# Patient Record
Sex: Male | Born: 1980 | Race: Black or African American | Hispanic: No | Marital: Married | Smoking: Light tobacco smoker
Health system: Southern US, Community
[De-identification: ages and names within clinical notes are randomized; demographics above are authoritative.]

## PROBLEM LIST (undated history)

## (undated) DIAGNOSIS — M109 Gout, unspecified: Secondary | ICD-10-CM

## (undated) DIAGNOSIS — K6289 Other specified diseases of anus and rectum: Secondary | ICD-10-CM

## (undated) DIAGNOSIS — I00 Rheumatic fever without heart involvement: Secondary | ICD-10-CM

## (undated) DIAGNOSIS — L0291 Cutaneous abscess, unspecified: Secondary | ICD-10-CM

## (undated) HISTORY — PX: ADENOIDECTOMY: SUR15

## (undated) HISTORY — DX: Rheumatic fever without heart involvement: I00

## (undated) HISTORY — PX: PILONIDAL CYST EXCISION: SHX744

## (undated) HISTORY — DX: Other specified diseases of anus and rectum: K62.89

---
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
## 2000-07-19 ENCOUNTER — Emergency Department (HOSPITAL_COMMUNITY): Admission: EM | Admit: 2000-07-19 | Discharge: 2000-07-19 | Payer: Self-pay | Admitting: Emergency Medicine

## 2004-12-13 ENCOUNTER — Emergency Department (HOSPITAL_COMMUNITY): Admission: EM | Admit: 2004-12-13 | Discharge: 2004-12-13 | Payer: Self-pay | Admitting: Emergency Medicine

## 2004-12-16 ENCOUNTER — Emergency Department (HOSPITAL_COMMUNITY): Admission: EM | Admit: 2004-12-16 | Discharge: 2004-12-16 | Payer: Self-pay | Admitting: Emergency Medicine

## 2004-12-19 ENCOUNTER — Emergency Department (HOSPITAL_COMMUNITY): Admission: EM | Admit: 2004-12-19 | Discharge: 2004-12-19 | Payer: Self-pay | Admitting: Emergency Medicine

## 2009-01-18 ENCOUNTER — Emergency Department (HOSPITAL_COMMUNITY): Admission: EM | Admit: 2009-01-18 | Discharge: 2009-01-18 | Payer: Self-pay | Admitting: Emergency Medicine

## 2010-11-10 ENCOUNTER — Emergency Department (HOSPITAL_COMMUNITY)
Admission: EM | Admit: 2010-11-10 | Discharge: 2010-11-10 | Disposition: A | Discharge status: Home / Self Care | Payer: BC Managed Care – PPO | Attending: Emergency Medicine | Admitting: Emergency Medicine

## 2010-11-10 DIAGNOSIS — K6289 Other specified diseases of anus and rectum: Secondary | ICD-10-CM | POA: Insufficient documentation

## 2010-11-10 LAB — OCCULT BLOOD, POC DEVICE: Fecal Occult Bld: NEGATIVE

## 2010-11-15 ENCOUNTER — Encounter (INDEPENDENT_AMBULATORY_CARE_PROVIDER_SITE_OTHER): Payer: Self-pay | Admitting: Surgery

## 2010-11-16 ENCOUNTER — Ambulatory Visit (INDEPENDENT_AMBULATORY_CARE_PROVIDER_SITE_OTHER): Payer: BC Managed Care – PPO | Admitting: Surgery

## 2010-11-16 ENCOUNTER — Encounter (INDEPENDENT_AMBULATORY_CARE_PROVIDER_SITE_OTHER): Payer: Self-pay | Admitting: Surgery

## 2010-11-16 VITALS — BP 130/94 | HR 68 | Temp 98.1°F | Ht 72.0 in | Wt 219.2 lb

## 2010-11-16 DIAGNOSIS — K602 Anal fissure, unspecified: Secondary | ICD-10-CM

## 2010-11-16 NOTE — Progress Notes (Addendum)
Chief Complaint  Patient presents with  . Other    new pt- rectal pain 2 weeks    Andrew Foley is a 30 y.o. ([birth year]) AA male who is a patient of Foley,NOELLE, PA, PA and comes to me today for evaluation of rectal pain/hemorrhoids.  The patient first noticed severe rectal pain after a bowel movement on or around Monday, 07 November 2010. He went to the [facility] emergency room on Wednesday, 09 November 2010 for the rectal pain. He then was seen by Altamease Oiler Foley with [facility] on Monday, 14 November 2010. She referred him to office for further evaluation.  He denies any history of prior rectal disease, peptic ulcer, disease liver disease, pancreatic disease, or colin disease. He has no history of Crohn's disease or colitis. He had no history of abdominal surgery.  Past Medical History  Diagnosis Date  . Rheumatic fever     as a child  . Rectal pain   . Hemorrhoids     Past Surgical History  Procedure Date  . Pilonidal cyst excision   . Adenoidectomy     Current Outpatient Prescriptions  Medication Sig Dispense Refill  . HYDROcodone-acetaminophen (VICODIN) 5-500 MG per tablet Every 6 hours as needed.      . hydrocortisone-pramoxine (PROCTOFOAM HC) rectal foam Place 1 applicator rectally 2 (two) times daily as needed.          Allergies  Allergen Reactions  . Codeine    Review of Systems: Skin:  No history of rash.  No history of abnormal moles. Infection:  No history of MRSA. Neurologic:  No history of stroke.  No history of seizure.  No history of headaches. Cardiac:  No history of hypertension. No history of heart disease.   Pulmonary:  Does not smoke cigarettes.  No asthma or bronchitis.    Endocrine:  No diabetes. No thyroid disease. Gastrointestinal: See HPI. Urologic:  No history of kidney stones.  No history of bladder infections. Musculoskeletal:  No history of joint or back disease. Hematologic:  No bleeding disorder.  No history of anemia.  Not  anticoagulated.  SOCIAL and FAMILY HISTORY: Accompanied by father, Andrew Foley. Works Photographer at [employer].  Lives with parents.  PHYSICAL EXAM: BP 130/94  Pulse 68  Temp(Src) 98.1 F (36.7 C) (Temporal)  Ht 6' (1.829 m)  Wt 219 lb 3.2 oz (99.428 kg)  BMI 29.73 kg/m2  HEENT: Normal. Pupils equal. Normal dentition. Neck: Supple. No thyroid mass. Lymph Nodes:  No supraclavicular or cervical nodes.  Abdomen: No mass. No tenderness. No hernia. Normal bowel sounds.  No abdominal scars. Rectal: Severe rectal spasm c/w anal fissure.  No external hemorrhoids.  No evidence of infection.  Extremities:  Good strength in upper and lower extremities. Neurologic:  Grossly intact to motor and sensory function.  DATA REVIEWED: Notes from Noelle Foley  ASSESSMENT and PLAN: 1.  Anal fissure. Literature given on rectal disease. He has Vicodin already.  Will also give him Diltiazem 2%.  Encourage sitz baths 3 x / day.  Stool softeners. See back in 4 weeks for recheck.  [mother - Andrew Foley [phone] - called because of her concern for son.  I tried to answer questions.  I will give him a prescription for Percocet (5/325) #30 and a note to be out of work till 11/30/10. dn 11/17/2010]    [Pt called for a refill of Percocet - Jade in office.  DN [phone]]  2.  Otherwise healthy.

## 2010-11-16 NOTE — Patient Instructions (Signed)
Diltiazem to anus 4x/day.  Sitz baths 3 times per day.  Stool softener (Colace) as needed.

## 2010-11-17 ENCOUNTER — Telehealth (INDEPENDENT_AMBULATORY_CARE_PROVIDER_SITE_OTHER): Payer: Self-pay

## 2010-11-17 ENCOUNTER — Other Ambulatory Visit (INDEPENDENT_AMBULATORY_CARE_PROVIDER_SITE_OTHER): Payer: Self-pay | Admitting: General Surgery

## 2010-11-17 DIAGNOSIS — K602 Anal fissure, unspecified: Secondary | ICD-10-CM

## 2010-11-17 NOTE — Telephone Encounter (Signed)
Patients mom called in and said her son is still having increased pain and hydrocodone is not working. She said he is taking the diltiazem and soaking 4x day and the pain will not be eased. She said he has missed 3 weeks of work, cannot not sleep, and that he cannot wait until his Sept 14th appointment. Is there any more suggestionsto help her son out?

## 2010-11-17 NOTE — Telephone Encounter (Signed)
Dr Ezzard Standing called and spoke to the mom.  He wrote for percocet and Proctofoam.  A return to work note was written

## 2010-11-28 ENCOUNTER — Ambulatory Visit (INDEPENDENT_AMBULATORY_CARE_PROVIDER_SITE_OTHER): Payer: BC Managed Care – PPO | Admitting: General Surgery

## 2010-12-06 ENCOUNTER — Telehealth (INDEPENDENT_AMBULATORY_CARE_PROVIDER_SITE_OTHER): Payer: Self-pay | Admitting: General Surgery

## 2010-12-06 MED ORDER — OXYCODONE-ACETAMINOPHEN 5-325 MG PO TABS: 1.0000 | ORAL_TABLET | Freq: Four times a day (QID) | ORAL | Status: AC | PRN

## 2010-12-06 NOTE — Telephone Encounter (Signed)
Patient has fissure. He is doing warm tub soaks and taking a stool softener with his pain medicine. He is taking oxycodone to help with the pain. Patient has gone back to work on light duty until his appt on 12/16/10. He is not taking pain medicine at work. He is asking for a refill of his oxycodone until he is seen back in the office. Dr Ezzard Standing paged for advise. Per Dr Ezzard Standing ok to refill his percocet 5/325 #30 with no refills. Please have partner write.  Patient's mother made aware that the prescription is at the front for pick up.

## 2010-12-16 ENCOUNTER — Encounter (INDEPENDENT_AMBULATORY_CARE_PROVIDER_SITE_OTHER): Payer: Self-pay | Admitting: Surgery

## 2010-12-16 ENCOUNTER — Ambulatory Visit (INDEPENDENT_AMBULATORY_CARE_PROVIDER_SITE_OTHER): Payer: BC Managed Care – PPO | Admitting: Surgery

## 2010-12-16 VITALS — BP 124/84 | HR 76 | Wt 218.6 lb

## 2010-12-16 DIAGNOSIS — K602 Anal fissure, unspecified: Secondary | ICD-10-CM

## 2010-12-16 NOTE — Progress Notes (Signed)
ASSESSMENT AND PLAN: 1. Anal fissure.   Clearly better.  Prescription for diltiazem.  I gave his a note for work today.  Return to office PRN.    2. Otherwise healthy.  HISTORY OF PRESENT ILLNESS: Chief complaint - Rectal fissure.  Andrew Foley is a 30 y.o. (DOB: November 16, 1980)  AA male who is a patient of REDMON,NOELLE, PA, PA and comes to me today for rectal fissure.  The patient is clearly better.  He is doing the sitz baths 2 to 3 x per day.  He is more regular with his bowel movements. He is not needing the pain meds any more.  We talked about surgery again and how it is a last resort.  He needs note for work.  And he needs another prescription for Diltiazem.  PHYSICAL EXAM: BP 124/84  Pulse 76  Wt 218 lb 9.6 oz (99.156 kg)  Rectum:  No inflammation.  Minimal discomfort on digital exam.  Much better than last exam.  Minimal rectal spasm.  I suggest he keeps using the diltiazem until the pain is completely resolved.  DATA REVIEWED: None

## 2011-03-10 ENCOUNTER — Encounter (HOSPITAL_COMMUNITY): Admission: RE | Payer: Self-pay | Source: Ambulatory Visit

## 2011-03-10 ENCOUNTER — Ambulatory Visit (HOSPITAL_COMMUNITY): Admission: RE | Admit: 2011-03-10 | Payer: BC Managed Care – PPO | Source: Ambulatory Visit | Admitting: Surgery

## 2011-03-10 SURGERY — SPHINCTEROTOMY, ANAL
Anesthesia: General | Site: Abdomen

## 2015-02-05 ENCOUNTER — Emergency Department (HOSPITAL_COMMUNITY)
Admission: EM | Admit: 2015-02-05 | Discharge: 2015-02-05 | Disposition: A | Discharge status: Home / Self Care | Payer: Self-pay | Attending: Emergency Medicine | Admitting: Emergency Medicine

## 2015-02-05 ENCOUNTER — Emergency Department (HOSPITAL_COMMUNITY): Payer: Self-pay

## 2015-02-05 ENCOUNTER — Encounter (HOSPITAL_COMMUNITY): Payer: Self-pay | Admitting: Emergency Medicine

## 2015-02-05 DIAGNOSIS — S93601A Unspecified sprain of right foot, initial encounter: Secondary | ICD-10-CM | POA: Insufficient documentation

## 2015-02-05 DIAGNOSIS — X500XXA Overexertion from strenuous movement or load, initial encounter: Secondary | ICD-10-CM | POA: Insufficient documentation

## 2015-02-05 DIAGNOSIS — Y998 Other external cause status: Secondary | ICD-10-CM | POA: Insufficient documentation

## 2015-02-05 DIAGNOSIS — Z720 Tobacco use: Secondary | ICD-10-CM | POA: Insufficient documentation

## 2015-02-05 DIAGNOSIS — Y9389 Activity, other specified: Secondary | ICD-10-CM | POA: Insufficient documentation

## 2015-02-05 DIAGNOSIS — Y9289 Other specified places as the place of occurrence of the external cause: Secondary | ICD-10-CM | POA: Insufficient documentation

## 2015-02-05 MED ORDER — IBUPROFEN 600 MG PO TABS: 600.0000 mg | ORAL_TABLET | Freq: Four times a day (QID) | ORAL | Status: DC | PRN

## 2015-02-05 NOTE — ED Notes (Signed)
Playing in yard last pm, twisted right great toe/foot. Slight swelling.

## 2015-02-05 NOTE — Discharge Instructions (Signed)
Read the information below.  Use the prescribed medication as directed.  Please discuss all new medications with your pharmacist.  You may return to the Emergency Department at any time for worsening condition or any new symptoms that concern you.    If you develop uncontrolled pain, weakness or numbness of the extremity, severe discoloration of the skin, or you are unable to walk or move your toe, return to the ER for a recheck.      Foot Sprain A foot sprain is an injury to one of the strong bands of tissue (ligaments) that connect and support the many bones in your feet. The ligament can be stretched too much or it can tear. A tear can be either partial or complete. The severity of the sprain depends on how much of the ligament was damaged or torn. CAUSES A foot sprain is usually caused by suddenly twisting or pivoting your foot. RISK FACTORS This injury is more likely to occur in people who:  Play a sport, such as basketball or football.  Exercise or play a sport without warming up.  Start a new workout or sport.  Suddenly increase how long or hard they exercise or play a sport. SYMPTOMS Symptoms of this condition start soon after an injury and include:  Pain, especially in the arch of the foot.  Bruising.  Swelling.  Inability to walk or use the foot to support body weight. DIAGNOSIS This condition is diagnosed with a medical history and physical exam. You may also have imaging tests, such as:  X-rays to make sure there are no broken bones (fractures).  MRI to see if the ligament has torn. TREATMENT Treatment varies depending on the severity of your sprain. Mild sprains can be treated with rest, ice, compression, and elevation (RICE). If your ligament is overstretched or partially torn, treatment usually involves keeping your foot in a fixed position (immobilization) for a period of time. To help you do this, your health care provider will apply a bandage, splint, or walking  boot to keep your foot from moving until it heals. You may also be advised to use crutches or a scooter for a few weeks to avoid bearing weight on your foot while it is healing. If your ligament is fully torn, you may need surgery to reconnect the ligament to the bone. After surgery, a cast or splint will be applied and will need to stay on your foot while it heals. Your health care provider may also suggest exercises or physical therapy to strengthen your foot. HOME CARE INSTRUCTIONS If You Have a Bandage, Splint, or Walking Boot:  Wear it as directed by your health care provider. Remove it only as directed by your health care provider.  Loosen the bandage, splint, or walking boot if your toes become numb and tingle, or if they turn cold and blue. Bathing  If your health care provider approves bathing and showering, cover the bandage or splint with a watertight plastic bag to protect it from water. Do not let the bandage or splint get wet. Managing Pain, Stiffness, and Swelling   If directed, apply ice to the injured area:  Put ice in a plastic bag.  Place a towel between your skin and the bag.  Leave the ice on for 20 minutes, 2-3 times per day.  Move your toes often to avoid stiffness and to lessen swelling.  Raise (elevate) the injured area above the level of your heart while you are sitting or lying down.  Driving  Do not drive or operate heavy machinery while taking pain medicine.  Do not drive while wearing a bandage, splint, or walking boot on a foot that you use for driving. Activity  Rest as directed by your health care provider.  Do not use the injured foot to support your body weight until your health care provider says that you can. Use crutches or other supportive devices as directed by your health care provider.  Ask your health care provider what activities are safe for you. Gradually increase how much and how far you walk until your health care provider says it is  safe to return to full activity.  Do any exercise or physical therapy as directed by your health care provider. General Instructions  If a splint was applied, do not put pressure on any part of it until it is fully hardened. This may take several hours.  Take medicines only as directed by your health care provider. These include over-the-counter medicines and prescription medicines.  Keep all follow-up visits as directed by your health care provider. This is important.  When you can walk without pain, wear supportive shoes that have stiff soles. Do not wear flip-flops, and do not walk barefoot. SEEK MEDICAL CARE IF:  Your pain is not controlled with medicine.  Your bruising or swelling gets worse or does not get better with treatment.  Your splint or walking boot is damaged. SEEK IMMEDIATE MEDICAL CARE IF:  Your foot is numb or blue.  Your foot feels colder than normal.   This information is not intended to replace advice given to you by your health care provider. Make sure you discuss any questions you have with your health care provider.   Document Released: 09/09/2001 Document Revised: 08/04/2014 Document Reviewed: 01/21/2014 Elsevier Interactive Patient Education 2016 Elsevier Inc.  Adult nurse and RICE WHAT DOES AN ELASTIC BANDAGE DO? Elastic bandages come in different shapes and sizes. They generally provide support to your injury and reduce swelling while you are healing, but they can perform different functions. Your health care provider will help you to decide what is best for your protection, recovery, or rehabilitation following an injury. WHAT ARE SOME GENERAL TIPS FOR USING AN ELASTIC BANDAGE?  Use the bandage as directed by the maker of the bandage that you are using.  Do not wrap the bandage too tightly. This may cut off the circulation in the arm or leg in the area below the bandage.  If part of your body beyond the bandage becomes blue, numb, cold,  swollen, or is more painful, your bandage is most likely too tight. If this occurs, remove your bandage and reapply it more loosely.  See your health care provider if the bandage seems to be making your problems worse rather than better.  An elastic bandage should be removed and reapplied every 3-4 hours or as directed by your health care provider. WHAT IS RICE? The routine care of many injuries includes rest, ice, compression, and elevation (RICE therapy).  Rest Rest is required to allow your body to heal. Generally, you can resume your routine activities when you are comfortable and have been given permission by your health care provider. Ice Icing your injury helps to keep the swelling down and it reduces pain. Do not apply ice directly to your skin.  Put ice in a plastic bag.  Place a towel between your skin and the bag.  Leave the ice on for 20 minutes, 2-3 times per day. Do  this for as long as you are directed by your health care provider. Compression Compression helps to keep swelling down, gives support, and helps with discomfort. Compression may be done with an elastic bandage. Elevation Elevation helps to reduce swelling and it decreases pain. If possible, your injured area should be placed at or above the level of your heart or the center of your chest. WHEN SHOULD I SEEK MEDICAL CARE? You should seek medical care if:  You have persistent pain and swelling.  Your symptoms are getting worse rather than improving. These symptoms may indicate that further evaluation or further X-rays are needed. Sometimes, X-rays may not show a small broken bone (fracture) until a number of days later. Make a follow-up appointment with your health care provider. Ask when your X-ray results will be ready. Make sure that you get your X-ray results. WHEN SHOULD I SEEK IMMEDIATE MEDICAL CARE? You should seek immediate medical care if:  You have a sudden onset of severe pain at or below the area of  your injury.  You develop redness or increased swelling around your injury.  You have tingling or numbness at or below the area of your injury that does not improve after you remove the elastic bandage.   This information is not intended to replace advice given to you by your health care provider. Make sure you discuss any questions you have with your health care provider.   Document Released: 09/09/2001 Document Revised: 12/09/2014 Document Reviewed: 11/03/2013 Elsevier Interactive Patient Education [employer]

## 2015-02-05 NOTE — ED Provider Notes (Signed)
CSN: [id number]     Arrival date & time 02/05/15  4098 History  By signing my name below, I, Soijett Blue, attest that this documentation has been prepared under the direction and in the presence of Trixie Dredge, PA-C Electronically Signed: Soijett Blue, ED Scribe. 02/05/2015. 9:38 AM.   Chief Complaint  Patient presents with  . Foot Pain      The history is provided by the patient. No language interpreter was used.    Andrew Foley is a 34 y.o. male who presents to the Emergency Department complaining of gradual onset of right foot pain x last night. He notes that he was playing in the yard with his son when he twisted his right great toe/foot in a small hole and heard a "pop" following. He reports that his right great toe pain/swelling began this morning. Pt is having associated symptoms of mild right great toe swelling. He notes that he has tried ibuprofen with no relief of his symptoms. He denies right calf pain, right ankle pain, gait problem, color change, rash, wound, and any other symptoms.    Past Medical History  Diagnosis Date  . Rheumatic fever     as a child  . Rectal pain   . Hemorrhoids    Past Surgical History  Procedure Laterality Date  . Pilonidal cyst excision    . Adenoidectomy     Family History  Problem Relation Age of Onset  . Hyperlipidemia Father   . Hyperlipidemia Brother    Social History  Substance Use Topics  . Smoking status: Current Every Day Smoker    Types: Cigars  . Smokeless tobacco: None  . Alcohol Use: 12.0 oz/week    20 Cans of beer per week    Review of Systems  Constitutional: Negative for fever.  Cardiovascular: Negative for leg swelling.  Musculoskeletal: Positive for joint swelling and arthralgias. Negative for gait problem.  Skin: Negative for color change, rash and wound.  Allergic/Immunologic: Negative for immunocompromised state.  Neurological: Negative for weakness and numbness.  Hematological: Does not bruise/bleed  easily.     Allergies  Codeine  Home Medications   Prior to Admission medications   Not on File   BP 125/90 mmHg  Pulse 71  Temp(Src) 97.2 F (36.2 C) (Oral)  Resp 16  Ht 6' (1.829 m)  Wt 230 lb (104.327 kg)  BMI 31.19 kg/m2  SpO2 100% Physical Exam  Constitutional: He appears well-developed and well-nourished. No distress.  HENT:  Head: Normocephalic and atraumatic.  Neck: Neck supple.  Pulmonary/Chest: Effort normal.  Musculoskeletal:       Right ankle: No tenderness. Achilles tendon normal.       Right lower leg: He exhibits no tenderness and no edema.       Right foot: There is tenderness.  Right lower extremity: TTP 1st metatarsal into great toe. Distal sensation intact. Cap refill less than 2 seconds. No break in skin. No discoloration. No other bony tenderness of foot or ankle. Achilles tendon intact. No calf tenderness or edema. No other focal bony tenderness of the lower leg. Distal pulses intact.   Neurological: He is alert.  Skin: He is not diaphoretic.  Nursing note and vitals reviewed.   ED Course  Procedures (including critical care time) DIAGNOSTIC STUDIES: Oxygen Saturation is 100% on RA, nl by my interpretation.    COORDINATION OF CARE: 9:33 AM Discussed treatment plan with pt at bedside which includes right foot xray and pt agreed to plan.  Labs Review Labs Reviewed - No data to display  Imaging Review Dg Foot Complete Right  02/05/2015  CLINICAL DATA:  Acute right foot pain after stepping in hole yesterday. EXAM: RIGHT FOOT COMPLETE - 3+ VIEW COMPARISON:  None. FINDINGS: There is no evidence of fracture or dislocation. There is no evidence of arthropathy or other focal bone abnormality. Soft tissues are unremarkable. IMPRESSION: Normal right foot. Electronically Signed   By: Lupita RaiderJames  Green Jr, M.D.   On: 02/05/2015 10:08   I have personally reviewed and evaluated these images as part of my medical decision-making.   EKG Interpretation None       MDM   Final diagnoses:  Right foot sprain, initial encounter    Afebrile, nontoxic patient with injury to his right great toe while playing with his child in the yard.  Neuro vascularly intact.   Xray negative.   D/C home with postop shoe, ibuprofen, PCP follow up PRN.  Discussed result, findings, treatment, and follow up  with patient.  Pt given return precautions.  Pt verbalizes understanding and agrees with plan.       I personally performed the services described in this documentation, which was scribed in my presence. The recorded information has been reviewed and is accurate.    Trixie Dredgemily Joe Gee, PA-C 02/05/15 1038  Mirian MoMatthew Gentry, MD 02/06/15 [phone]

## 2015-02-14 ENCOUNTER — Emergency Department (HOSPITAL_COMMUNITY): Payer: Self-pay

## 2015-02-14 ENCOUNTER — Encounter (HOSPITAL_COMMUNITY): Payer: Self-pay

## 2015-02-14 ENCOUNTER — Emergency Department (HOSPITAL_COMMUNITY)
Admission: EM | Admit: 2015-02-14 | Discharge: 2015-02-14 | Disposition: A | Discharge status: Home / Self Care | Payer: Self-pay | Attending: Emergency Medicine | Admitting: Emergency Medicine

## 2015-02-14 DIAGNOSIS — Z8719 Personal history of other diseases of the digestive system: Secondary | ICD-10-CM | POA: Insufficient documentation

## 2015-02-14 DIAGNOSIS — W1842XD Slipping, tripping and stumbling without falling due to stepping into hole or opening, subsequent encounter: Secondary | ICD-10-CM | POA: Insufficient documentation

## 2015-02-14 DIAGNOSIS — M79674 Pain in right toe(s): Secondary | ICD-10-CM

## 2015-02-14 DIAGNOSIS — F17210 Nicotine dependence, cigarettes, uncomplicated: Secondary | ICD-10-CM | POA: Insufficient documentation

## 2015-02-14 DIAGNOSIS — Z8619 Personal history of other infectious and parasitic diseases: Secondary | ICD-10-CM | POA: Insufficient documentation

## 2015-02-14 DIAGNOSIS — S99921D Unspecified injury of right foot, subsequent encounter: Secondary | ICD-10-CM | POA: Insufficient documentation

## 2015-02-14 DIAGNOSIS — R52 Pain, unspecified: Secondary | ICD-10-CM

## 2015-02-14 NOTE — ED Notes (Signed)
Pt stable, ambulatory, states understanding of discharge instructions 

## 2015-02-14 NOTE — Discharge Instructions (Signed)
Elevate the area as much as possible. Follow up with ortho, Dr. Eulah PontMurphy, continue to take ibuprofen.

## 2015-02-14 NOTE — ED Provider Notes (Signed)
CSN: [id number]     Arrival date & time 02/14/15  1607 History  By signing my name below, I, Andrew Foley, attest that this documentation has been prepared under the direction and in the presence of Andrew Buffalo, NP. Electronically Signed: Octavia Foley, ED Scribe. 02/14/2015. 6:26 PM.     Chief Complaint  Patient presents with  . Foot Pain      Patient is a 34 y.o. male presenting with lower extremity pain. The history is provided by the patient. No language interpreter was used.  Foot Pain This is a new problem. The current episode started more than 1 week ago. The problem has been gradually worsening. Pertinent negatives include no chest pain, no abdominal pain, no headaches and no shortness of breath. The symptoms are aggravated by walking. Nothing relieves the symptoms. The treatment provided no relief.   HPI Comments: Andrew Foley is a 34 y.o. male who presents to the Emergency Department complaining of constant, gradual worsening right foot pain onset last week. Pt has associated edema and ecchymosis to his right big toe. Pt was seen on 11/4 for the same foot injury. Pt was playing with children when his right foot stepped in a hole and injured his right big toe. He had an x-ray done that came back normal. He has been taking ibuprofen to alleviate the pain with some relief. Pt states he is unable to apply pressure to his foot due to increased pain.   Past Medical History  Diagnosis Date  . Rheumatic fever     as a child  . Rectal pain   . Hemorrhoids    Past Surgical History  Procedure Laterality Date  . Pilonidal cyst excision    . Adenoidectomy     Family History  Problem Relation Age of Onset  . Hyperlipidemia Father   . Hyperlipidemia Brother    Social History  Substance Use Topics  . Smoking status: Current Every Day Smoker    Types: Cigars  . Smokeless tobacco: None  . Alcohol Use: 12.0 oz/week    20 Cans of beer per week    Review of Systems   Respiratory: Negative for shortness of breath.   Cardiovascular: Negative for chest pain.  Gastrointestinal: Negative for abdominal pain.  Musculoskeletal: Positive for joint swelling and arthralgias.  Skin: Positive for color change.  Neurological: Negative for headaches.  All other systems reviewed and are negative.     Allergies  Codeine  Home Medications   Prior to Admission medications   Medication Sig Start Date End Date Taking? Authorizing Provider  ibuprofen (ADVIL,MOTRIN) 600 MG tablet Take 1 tablet (600 mg total) by mouth every 6 (six) hours as needed for mild pain or moderate pain. 02/05/15   Trixie Dredge, PA-C   Triage vitals: BP 129/85 mmHg  Pulse 73  Temp(Src) 98.3 F (36.8 C) (Oral)  Resp 18  SpO2 100% Physical Exam  Constitutional: He is oriented to person, place, and time. He appears well-developed and well-nourished. No distress.  HENT:  Head: Normocephalic and atraumatic.  Eyes: EOM are normal. Pupils are equal, round, and reactive to light.  Neck: Normal range of motion. Neck supple.  Cardiovascular: Normal rate.   Pulmonary/Chest: Effort normal.  Musculoskeletal:       Right foot: There is tenderness and swelling. There is normal range of motion, normal capillary refill, no crepitus, no deformity and no laceration.       Feet:  Tender to palpation of the great toe on  the right and to the ball of the foot, 2+ pulse, ecchymosis noted at base of great toe, plantar and dorsiflexion normal.  Neurological: He is alert and oriented to person, place, and time. No cranial nerve deficit.  Skin: Skin is warm and dry.  Psychiatric: He has a normal mood and affect. His behavior is normal.  Nursing note and vitals reviewed.   ED Course  Procedures  DIAGNOSTIC STUDIES: Oxygen Saturation is 100% on RA, normal by my interpretation.  COORDINATION OF CARE:  5:22 PM Discussed treatment plan with pt at bedside and pt agreed to plan. X-ray, buddy tape, post op shoe,  ice, elevation, ibuprofen Limited ambulation for the next week and follow up with ortho  Labs Review Labs Reviewed - No data to display  Imaging Review Dg Toe Great Right  02/14/2015  CLINICAL DATA:  Stepped in hole injuring right big toe 02/04/2015. Persistent pain. EXAM: RIGHT GREAT TOE COMPARISON:  None. FINDINGS: There is no evidence of fracture or dislocation. There is no evidence of arthropathy or other focal bone abnormality. Soft tissues are unremarkable. IMPRESSION: Negative. Electronically Signed   By: Elberta Fortisaniel  Boyle M.D.   On: 02/14/2015 18:13   I have personally reviewed and evaluated these images and lab results as part of my medical decision-making.   MDM  34 y.o. male with continued pain and swelling of the right great toe s/p injury 02/05/15. Stable for d/c without focal neuro deficits. Will d/c home to follow up with ortho. Discussed with the patient and all questioned fully answered.   Final diagnoses:  Great toe pain, right   I personally performed the services described in this documentation, which was scribed in my presence. The recorded information has been reviewed and is accurate.   HalltownHope M Neese, NP 02/14/15 1831  Derwood KaplanAnkit Nanavati, MD 02/14/15 2351

## 2015-02-14 NOTE — ED Notes (Signed)
Pt injured right foot 02-05-15.  Is unable to put full pressure on right foot and is still painful.  Wants foot assessed again.

## 2016-04-15 ENCOUNTER — Emergency Department (HOSPITAL_COMMUNITY)
Admission: EM | Admit: 2016-04-15 | Discharge: 2016-04-15 | Disposition: A | Discharge status: Home / Self Care | Payer: Self-pay | Attending: Emergency Medicine | Admitting: Emergency Medicine

## 2016-04-15 DIAGNOSIS — F17290 Nicotine dependence, other tobacco product, uncomplicated: Secondary | ICD-10-CM | POA: Insufficient documentation

## 2016-04-15 DIAGNOSIS — K610 Anal abscess: Secondary | ICD-10-CM | POA: Insufficient documentation

## 2016-04-15 DIAGNOSIS — L0291 Cutaneous abscess, unspecified: Secondary | ICD-10-CM

## 2016-04-15 DIAGNOSIS — Z79899 Other long term (current) drug therapy: Secondary | ICD-10-CM | POA: Insufficient documentation

## 2016-04-15 MED ORDER — LIDOCAINE-EPINEPHRINE (PF) 2 %-1:200000 IJ SOLN
10.0000 mL | Freq: Once | INTRAMUSCULAR | Status: AC
  Administered 2016-04-15: 10 mL
  Filled 2016-04-15: qty 20

## 2016-04-15 MED ORDER — ACETAMINOPHEN 500 MG PO TABS
1000.0000 mg | ORAL_TABLET | Freq: Once | ORAL | Status: AC
  Administered 2016-04-15: 1000 mg via ORAL
  Filled 2016-04-15: qty 2

## 2016-04-15 MED ORDER — IBUPROFEN 200 MG PO TABS
400.0000 mg | ORAL_TABLET | Freq: Once | ORAL | Status: AC
  Administered 2016-04-15: 400 mg via ORAL
  Filled 2016-04-15: qty 2

## 2016-04-15 MED ORDER — SULFAMETHOXAZOLE-TRIMETHOPRIM 800-160 MG PO TABS: 1.0000 | ORAL_TABLET | Freq: Two times a day (BID) | ORAL | 0 refills | Status: AC

## 2016-04-15 NOTE — ED Provider Notes (Signed)
[facility]-EMERGENCY DEPT Provider Note   CSN: [id number] Arrival date & time: 04/15/16  0957     History   Chief Complaint Chief Complaint  Patient presents with  . Abscess    HPI Andrew Foley is a 36 y.o. male with pertinent past medical history of hemorrhoids and pilonidal cyst excision presents to the ED with abscess to left buttock associated with pain, swelling 1 week. Patient states that walking and sitting down exacerbate the pain. Patient is having normal bowel movements. No pain with bowel movements. Patient has been taking Tylenol and ibuprofen for pain and has been doing Epsom salts soaks. Patient had one episode of chills yesterday, denies fevers. No personal history of diabetes. Patient had one abscess that required incision and drainage over 10 years ago on right buttock.  HPI  Past Medical History:  Diagnosis Date  . Hemorrhoids   . Rectal pain   . Rheumatic fever    as a child    There are no active problems to display for this patient.   Past Surgical History:  Procedure Laterality Date  . ADENOIDECTOMY    . PILONIDAL CYST EXCISION         Home Medications    Prior to Admission medications   Medication Sig Start Date End Date Taking? Authorizing Provider  diphenhydramine-acetaminophen (TYLENOL PM) 25-500 MG TABS tablet Take 1 tablet by mouth at bedtime as needed (sleep).   Yes Historical Provider, MD  ibuprofen (ADVIL,MOTRIN) 200 MG tablet Take 200 mg by mouth every 6 (six) hours as needed.   Yes Historical Provider, MD  ibuprofen (ADVIL,MOTRIN) 600 MG tablet Take 1 tablet (600 mg total) by mouth every 6 (six) hours as needed for mild pain or moderate pain. Patient not taking: Reported on 04/15/2016 02/05/15   Trixie Dredge, PA-C  sulfamethoxazole-trimethoprim (BACTRIM DS,SEPTRA DS) 800-160 MG tablet Take 1 tablet by mouth 2 (two) times daily. 04/15/16 04/22/16  Liberty Handy, PA-C    Family History Family History  Problem Relation Age of Onset  .  Hyperlipidemia Father   . Hyperlipidemia Brother     Social History Social History  Substance Use Topics  . Smoking status: Current Every Day Smoker    Types: Cigars  . Smokeless tobacco: Not on file  . Alcohol use 12.0 oz/week    20 Cans of beer per week     Allergies   Codeine   Review of Systems Review of Systems  Constitutional: Positive for chills. Negative for fever.  HENT: Negative for congestion and sore throat.   Eyes: Negative for visual disturbance.  Respiratory: Negative for cough and shortness of breath.   Cardiovascular: Negative for chest pain and palpitations.  Gastrointestinal: Negative for abdominal pain, blood in stool, constipation, diarrhea, nausea and vomiting.  Genitourinary: Negative for difficulty urinating, flank pain and hematuria.  Musculoskeletal: Negative for joint swelling and myalgias.  Skin: Negative for rash.       Abscess L buttock  Neurological: Negative for dizziness, syncope, weakness, light-headedness and headaches.  Hematological: Negative.   Psychiatric/Behavioral: Negative.      Physical Exam Updated Vital Signs BP (!) 131/107   Pulse 89   Temp 97.8 F (36.6 C) (Oral)   Resp 18   SpO2 100%   Physical Exam  Constitutional: He is oriented to person, place, and time. He appears well-developed and well-nourished. No distress.  Pt found laying on his R side.  HENT:  Head: Normocephalic and atraumatic.  Nose: Nose normal.  Mouth/Throat:  Oropharynx is clear and moist. No oropharyngeal exudate.  Eyes: Conjunctivae and EOM are normal. Pupils are equal, round, and reactive to light.  Neck: Normal range of motion. Neck supple. No JVD present. No tracheal deviation present.  Cardiovascular: Normal rate, regular rhythm, normal heart sounds and intact distal pulses.   No murmur heard. Pulmonary/Chest: Effort normal and breath sounds normal. No respiratory distress. He has no wheezes. He has no rales.  Abdominal: Soft. Bowel sounds  are normal. He exhibits no distension. There is no tenderness.  Musculoskeletal: Normal range of motion. He exhibits no deformity.  Lymphadenopathy:    He has no cervical adenopathy.  Neurological: He is alert and oriented to person, place, and time.  Skin: Skin is warm and dry. Capillary refill takes less than 2 seconds.  3 cm area of fluctuance and induration on L buttock located 2 cm to the left of anus without erythema, tender. Induration and fluctuance do not extend into scrotum, testicles or anus.   Psychiatric: He has a normal mood and affect. His behavior is normal. Judgment and thought content normal.  Nursing note and vitals reviewed.    ED Treatments / Results  Labs (all labs ordered are listed, but only abnormal results are displayed) Labs Reviewed - No data to display  EKG  EKG Interpretation None       Radiology No results found.  Procedures .Marland Kitchen.Incision and Drainage Date/Time: 04/15/2016 1:08 PM Performed by: Liberty HandyGIBBONS, Tamryn Popko J Authorized by: Liberty HandyGIBBONS, Renton Berkley J   Consent:    Consent obtained:  Verbal   Consent given by:  Patient   Risks discussed:  Bleeding, incomplete drainage, infection and pain   Alternatives discussed:  No treatment and delayed treatment Location:    Type:  Abscess   Size:  4 cm   Location:  Anogenital   Anogenital location:  Perianal Pre-procedure details:    Skin preparation:  Betadine Anesthesia (see MAR for exact dosages):    Anesthesia method:  Local infiltration   Local anesthetic:  Lidocaine 1% WITH epi Procedure type:    Complexity:  Simple Procedure details:    Needle aspiration: no     Incision types:  Stab incision and single straight   Incision depth:  Subcutaneous   Scalpel blade:  11   Wound management:  Probed and deloculated   Drainage:  Bloody, purulent and serosanguinous   Drainage amount:  Moderate   Wound treatment:  Wound left open   Packing materials:  1/4 in gauze   Amount 1/4":  5cm Post-procedure  details:    Patient tolerance of procedure:  Tolerated well, no immediate complications Comments:     Dressed by RN   (including critical care time)  Medications Ordered in ED Medications  ibuprofen (ADVIL,MOTRIN) tablet 400 mg (400 mg Oral Given 04/15/16 1223)  acetaminophen (TYLENOL) tablet 1,000 mg (1,000 mg Oral Given 04/15/16 1223)  lidocaine-EPINEPHrine (XYLOCAINE W/EPI) 2 %-1:200000 (PF) injection 10 mL (10 mLs Infiltration Given by Other 04/15/16 1229)    Initial Impression / Assessment and Plan / ED Course  I have reviewed the triage vital signs and the nursing notes.  Pertinent labs & imaging results that were available during my care of the patient were reviewed by me and considered in my medical decision making (see chart for details).  Clinical Course    Patient tolerated incision and drainage without difficulty, moderate amount of purulent discharge obtained. Patient requested antibiotics, given. ED return instructions given.  Final Clinical Impressions(s) / ED Diagnoses  Final diagnoses:  Abscess    New Prescriptions New Prescriptions   SULFAMETHOXAZOLE-TRIMETHOPRIM (BACTRIM DS,SEPTRA DS) 800-160 MG TABLET    Take 1 tablet by mouth 2 (two) times daily.     Liberty Handy, PA-C 04/15/16 1312    Pricilla Loveless, MD 04/18/16 [phone]

## 2016-04-15 NOTE — ED Triage Notes (Signed)
Abscess to left lower gluteal cleft with surrounding induration.

## 2016-04-15 NOTE — ED Notes (Signed)
Bed: WTR7 Expected date:  Expected time:  Means of arrival:  Comments: 

## 2016-04-15 NOTE — Discharge Instructions (Signed)
Please read the attached information on perirectal abscess.   You have been prescribed antibiotics as you requested.   Return to the emergency department if you notice increased swelling, pain and redness in the area or if you develop fevers.

## 2016-09-02 ENCOUNTER — Encounter (HOSPITAL_COMMUNITY): Payer: Self-pay | Admitting: Oncology

## 2016-09-02 ENCOUNTER — Emergency Department (HOSPITAL_COMMUNITY)
Admission: EM | Admit: 2016-09-02 | Discharge: 2016-09-03 | Disposition: A | Discharge status: Home / Self Care | Payer: Commercial Managed Care - PPO | Attending: Emergency Medicine | Admitting: Emergency Medicine

## 2016-09-02 DIAGNOSIS — F17290 Nicotine dependence, other tobacco product, uncomplicated: Secondary | ICD-10-CM | POA: Diagnosis not present

## 2016-09-02 DIAGNOSIS — Z791 Long term (current) use of non-steroidal anti-inflammatories (NSAID): Secondary | ICD-10-CM | POA: Insufficient documentation

## 2016-09-02 DIAGNOSIS — L0231 Cutaneous abscess of buttock: Secondary | ICD-10-CM | POA: Insufficient documentation

## 2016-09-02 DIAGNOSIS — L0291 Cutaneous abscess, unspecified: Secondary | ICD-10-CM

## 2016-09-02 HISTORY — DX: Cutaneous abscess, unspecified: L02.91

## 2016-09-02 NOTE — ED Triage Notes (Signed)
Pt has an abscess to his right buttock x 2 weeks.  Pt was seen by his PCp and placed on 15 day course of antibiotics that he started taking on Tuesday.  Pt states he is concerned because the abscess has began to drain, however he believes exudate may be coming from rectum.  Pt rates pain 8/10.

## 2016-09-03 NOTE — Discharge Instructions (Signed)
The abscess on her left buttock, appears to be improving, with the drainage which you have experienced.  The antibiotic, sulfamethoxazole/trimethoprim, seems to be working.  To help the wound heal, soak in a warm tub 2 or 3 times a day, for 30 minutes.  If you feel the area is not healing, swelling or having other problems go see your doctor for additional treatment.

## 2016-09-03 NOTE — ED Provider Notes (Signed)
[facility]-EMERGENCY DEPT Provider Note   CSN: [id number] Arrival date & time: 09/02/16  1921  By signing my name below, I, [name], attest that this documentation has been prepared under the direction and in the presence of Mancel BaleWentz, Beyonca Wisz, MD. Electronically Signed: [name], ED Scribe. 09/03/16. 1:23 AM.  History   Chief Complaint Chief Complaint  Patient presents with  . Abscess   The history is provided by the patient. No language interpreter was used.   HPI Comments: Andrew Foley is a 36 y.o. male who presents to the Emergency Department complaining of mild intermittent gradually resolving lower left buttock pain due to a wound that he noticed in January of 2018. He reports that he is able to ambulate and pass BMs normally and without pain. The pt denies nausea, vomiting, diarrhea, and fever.  He began to have more pain in his buttocks, about a week ago, saw his PCP and was prescribed Septra for it.  Since starting this after he has had some draining and decreased pain and swelling in the area of the abscess.  He denies painful bowel movements.  There are no other no modifying factors.    Past Medical History:  Diagnosis Date  . Abscess   . Hemorrhoids   . Rectal pain   . Rheumatic fever    as a child    There are no active problems to display for this patient.   Past Surgical History:  Procedure Laterality Date  . ADENOIDECTOMY    . PILONIDAL CYST EXCISION         Home Medications    Prior to Admission medications   Medication Sig Start Date End Date Taking? Authorizing Provider  ibuprofen (ADVIL,MOTRIN) 200 MG tablet Take 400 mg by mouth every 6 (six) hours as needed for headache, mild pain or moderate pain.    Yes [provider]  sulfamethoxazole-trimethoprim (BACTRIM DS,SEPTRA DS) 800-160 MG tablet Take 1 tablet by mouth 2 (two) times daily. 08/29/16 09/13/16 Yes [provider]  ibuprofen (ADVIL,MOTRIN) 600 MG tablet Take 1  tablet (600 mg total) by mouth every 6 (six) hours as needed for mild pain or moderate pain. Patient not taking: Reported on 04/15/2016 02/05/15   Trixie DredgeWest, Emily, PA-C    Family History Family History  Problem Relation Age of Onset  . Hyperlipidemia Father   . Hyperlipidemia Brother     Social History Social History  Substance Use Topics  . Smoking status: Current Every Day Smoker    Types: Cigars  . Smokeless tobacco: Never Used  . Alcohol use 12.0 oz/week    20 Cans of beer per week     Allergies   Codeine   Review of Systems Review of Systems  Constitutional: Negative for fever.  Gastrointestinal: Negative for diarrhea, nausea and vomiting.  Skin: Positive for wound.  All other systems reviewed and are negative.    Physical Exam Updated Vital Signs BP (!) 120/91 (BP Location: Left Arm)   Pulse 70   Temp 98.7 F (37.1 C) (Oral)   Resp 20   Ht 6' (1.829 m)   Wt 108.9 kg (240 lb)   SpO2 99%   BMI 32.55 kg/m   Physical Exam  Constitutional: He is oriented to person, place, and time. He appears well-developed and well-nourished.  HENT:  Head: Normocephalic and atraumatic.  Right Ear: External ear normal.  Left Ear: External ear normal.  Eyes: Conjunctivae and EOM are normal. Pupils are equal, round, and reactive to  light.  Neck: Normal range of motion and phonation normal. Neck supple.  Cardiovascular: Normal rate.   Pulmonary/Chest: Effort normal. He exhibits no bony tenderness.  Musculoskeletal: Normal range of motion.  Neurological: He is alert and oriented to person, place, and time. No cranial nerve deficit or sensory deficit. He exhibits normal muscle tone. Coordination normal.  Skin: Skin is warm, dry and intact.  Left mid buttocks with small indurated area, about 1 x 2 and half centimeters, with 2 areas of drainage, with minimal discharge.  No associated fluctuance, erythema, and no tracking towards the anus.  Psychiatric: He has a normal mood and  affect. His behavior is normal. Judgment and thought content normal.  Nursing note and vitals reviewed.    ED Treatments / Results   DIAGNOSTIC STUDIES: Oxygen Saturation is 99% on RA, normal by my interpretation.   COORDINATION OF CARE: 12:55 AM-Discussed next steps with pt. Pt verbalized understanding and is agreeable with the plan.    Labs (all labs ordered are listed, but only abnormal results are displayed) Labs Reviewed - No data to display  EKG  EKG Interpretation None       Radiology No results found.  Procedures Procedures (including critical care time)  Medications Ordered in ED Medications - No data to display   Initial Impression / Assessment and Plan / ED Course  I have reviewed the triage vital signs and the nursing notes.  Pertinent labs & imaging results that were available during my care of the patient were reviewed by me and considered in my medical decision making (see chart for details).      Patient Vitals for the past 24 hrs:  BP Temp Temp src Pulse Resp SpO2 Height Weight  09/02/16 2249 (!) 120/91 98.7 F (37.1 C) Oral 70 20 99 % - -  09/02/16 1937 139/88 98.2 F (36.8 C) Oral 73 18 96 % 6' (1.829 m) 108.9 kg (240 lb)    At D/C-  Reevaluation with update and discussion. After initial assessment and treatment, an updated evaluation reveals patient is comfortable findings discussed and questions answered. [name]    Final Clinical Impressions(s) / ED Diagnoses   Final diagnoses:  Abscess    Recurrent buttocks abscess, currently draining, without significant tenderness, and no apparent need for I&D at this time.  Doubt fistula.  Nursing Notes Reviewed/ Care Coordinated Applicable Imaging Reviewed Interpretation of Laboratory Data incorporated into ED treatment  The patient appears reasonably screened and/or stabilized for discharge and I doubt any other medical condition or other [facility] requiring further screening,  evaluation, or treatment in the ED at this time prior to discharge.  Plan: Home Medications-continue taking Septra; Home Treatments-warm soaks 2 or 3 times a day; return here if the recommended treatment, does not improve the symptoms; Recommended follow up-PCP, as needed   New Prescriptions Discharge Medication List as of 09/03/2016  1:14 AM     I personally performed the services described in this documentation, which was scribed in my presence. The recorded information has been reviewed and is accurate.      Mancel Bale, MD 09/03/16 [phone]

## 2017-05-16 DIAGNOSIS — K610 Anal abscess: Secondary | ICD-10-CM | POA: Insufficient documentation

## 2017-05-25 HISTORY — PX: INCISION AND DRAINAGE ABSCESS ANAL: SUR669

## 2017-06-01 ENCOUNTER — Emergency Department (HOSPITAL_COMMUNITY): Payer: PRIVATE HEALTH INSURANCE | Admitting: Certified Registered Nurse Anesthetist

## 2017-06-01 ENCOUNTER — Encounter (HOSPITAL_COMMUNITY): Admission: EM | Disposition: A | Discharge status: Home / Self Care | Payer: Self-pay | Source: Home / Self Care

## 2017-06-01 ENCOUNTER — Other Ambulatory Visit: Payer: Self-pay

## 2017-06-01 ENCOUNTER — Observation Stay (HOSPITAL_COMMUNITY)
Admission: EM | Admit: 2017-06-01 | Discharge: 2017-06-02 | Disposition: A | Discharge status: Home / Self Care | Payer: PRIVATE HEALTH INSURANCE | Attending: Surgery | Admitting: Surgery

## 2017-06-01 ENCOUNTER — Encounter (HOSPITAL_COMMUNITY): Payer: Self-pay | Admitting: Internal Medicine

## 2017-06-01 DIAGNOSIS — Z79899 Other long term (current) drug therapy: Secondary | ICD-10-CM | POA: Diagnosis not present

## 2017-06-01 DIAGNOSIS — K91840 Postprocedural hemorrhage and hematoma of a digestive system organ or structure following a digestive system procedure: Secondary | ICD-10-CM | POA: Diagnosis not present

## 2017-06-01 DIAGNOSIS — F17290 Nicotine dependence, other tobacco product, uncomplicated: Secondary | ICD-10-CM | POA: Insufficient documentation

## 2017-06-01 DIAGNOSIS — Z6833 Body mass index (BMI) 33.0-33.9, adult: Secondary | ICD-10-CM | POA: Insufficient documentation

## 2017-06-01 DIAGNOSIS — Y92234 Operating room of hospital as the place of occurrence of the external cause: Secondary | ICD-10-CM | POA: Diagnosis not present

## 2017-06-01 DIAGNOSIS — T8119XA Other postprocedural shock, initial encounter: Secondary | ICD-10-CM

## 2017-06-01 DIAGNOSIS — Y838 Other surgical procedures as the cause of abnormal reaction of the patient, or of later complication, without mention of misadventure at the time of the procedure: Secondary | ICD-10-CM | POA: Insufficient documentation

## 2017-06-01 DIAGNOSIS — R578 Other shock: Secondary | ICD-10-CM

## 2017-06-01 DIAGNOSIS — K625 Hemorrhage of anus and rectum: Secondary | ICD-10-CM | POA: Diagnosis present

## 2017-06-01 DIAGNOSIS — Z885 Allergy status to narcotic agent status: Secondary | ICD-10-CM | POA: Insufficient documentation

## 2017-06-01 HISTORY — PX: ANAL EXAMINATION UNDER ANESTHESIA: SHX1138

## 2017-06-01 LAB — PROTIME-INR
INR: 1.29
Prothrombin Time: 16 seconds — ABNORMAL HIGH (ref 11.4–15.2)

## 2017-06-01 LAB — CBC WITH DIFFERENTIAL/PLATELET
BASOS ABS: 0 10*3/uL (ref 0.0–0.1)
Basophils Absolute: 0 10*3/uL (ref 0.0–0.1)
Basophils Relative: 0 %
Basophils Relative: 0 %
EOS ABS: 0.1 10*3/uL (ref 0.0–0.7)
EOS PCT: 1 %
Eosinophils Absolute: 0 10*3/uL (ref 0.0–0.7)
Eosinophils Relative: 0 %
HCT: 41.8 % (ref 39.0–52.0)
HEMATOCRIT: 30.6 % — AB (ref 39.0–52.0)
Hemoglobin: 13.8 g/dL (ref 13.0–17.0)
Hemoglobin: 10 g/dL — ABNORMAL LOW (ref 13.0–17.0)
LYMPHS PCT: 11 %
Lymphocytes Relative: 43 %
Lymphs Abs: 3.1 10*3/uL (ref 0.7–4.0)
Lymphs Abs: 1.1 10*3/uL (ref 0.7–4.0)
MCH: 30.5 pg (ref 26.0–34.0)
MCH: 29.2 pg (ref 26.0–34.0)
MCHC: 33 g/dL (ref 30.0–36.0)
MCHC: 32.7 g/dL (ref 30.0–36.0)
MCV: 92.3 fL (ref 78.0–100.0)
MCV: 89.2 fL (ref 78.0–100.0)
MONO ABS: 0.3 10*3/uL (ref 0.1–1.0)
MONOS PCT: 7 %
Monocytes Absolute: 0.7 10*3/uL (ref 0.1–1.0)
Monocytes Relative: 4 %
NEUTROS ABS: 8.1 10*3/uL — AB (ref 1.7–7.7)
Neutro Abs: 3.8 10*3/uL (ref 1.7–7.7)
Neutrophils Relative %: 52 %
Neutrophils Relative %: 82 %
PLATELETS: 321 10*3/uL (ref 150–400)
Platelets: 176 10*3/uL (ref 150–400)
RBC: 4.53 MIL/uL (ref 4.22–5.81)
RBC: 3.43 MIL/uL — ABNORMAL LOW (ref 4.22–5.81)
RDW: 12.7 % (ref 11.5–15.5)
RDW: 13.6 % (ref 11.5–15.5)
WBC: 7.3 10*3/uL (ref 4.0–10.5)
WBC: 9.9 10*3/uL (ref 4.0–10.5)

## 2017-06-01 LAB — CBC
HEMATOCRIT: 31.7 % — AB (ref 39.0–52.0)
Hemoglobin: 10.4 g/dL — ABNORMAL LOW (ref 13.0–17.0)
MCH: 29.7 pg (ref 26.0–34.0)
MCHC: 32.8 g/dL (ref 30.0–36.0)
MCV: 90.6 fL (ref 78.0–100.0)
Platelets: 205 10*3/uL (ref 150–400)
RBC: 3.5 MIL/uL — ABNORMAL LOW (ref 4.22–5.81)
RDW: 13.2 % (ref 11.5–15.5)
WBC: 10.7 10*3/uL — ABNORMAL HIGH (ref 4.0–10.5)

## 2017-06-01 LAB — I-STAT CHEM 8, ED
BUN: 17 mg/dL (ref 6–20)
CALCIUM ION: 1.17 mmol/L (ref 1.15–1.40)
Chloride: 103 mmol/L (ref 101–111)
Creatinine, Ser: 1.2 mg/dL (ref 0.61–1.24)
Glucose, Bld: 105 mg/dL — ABNORMAL HIGH (ref 65–99)
HCT: 43 % (ref 39.0–52.0)
HEMOGLOBIN: 14.6 g/dL (ref 13.0–17.0)
Potassium: 3.8 mmol/L (ref 3.5–5.1)
SODIUM: 140 mmol/L (ref 135–145)
TCO2: 26 mmol/L (ref 22–32)

## 2017-06-01 LAB — MRSA PCR SCREENING: MRSA by PCR: NEGATIVE

## 2017-06-01 LAB — ABO/RH: ABO/RH(D): O POS

## 2017-06-01 LAB — PREPARE RBC (CROSSMATCH)

## 2017-06-01 SURGERY — EXAM UNDER ANESTHESIA
Anesthesia: General | Site: Anus

## 2017-06-01 MED ORDER — 0.9 % SODIUM CHLORIDE (POUR BTL) OPTIME
TOPICAL | Status: DC | PRN
  Administered 2017-06-01: 1000 mL

## 2017-06-01 MED ORDER — CEFAZOLIN SODIUM-DEXTROSE 2-3 GM-%(50ML) IV SOLR
INTRAVENOUS | Status: DC | PRN
  Administered 2017-06-01: 2 g via INTRAVENOUS

## 2017-06-01 MED ORDER — ALBUMIN HUMAN 5 % IV SOLN
12.5000 g | Freq: Once | INTRAVENOUS | Status: AC
  Administered 2017-06-01: 12.5 g via INTRAVENOUS

## 2017-06-01 MED ORDER — PANTOPRAZOLE SODIUM 40 MG IV SOLR
40.0000 mg | Freq: Every day | INTRAVENOUS | Status: DC
  Administered 2017-06-01: 40 mg via INTRAVENOUS
  Filled 2017-06-01: qty 40

## 2017-06-01 MED ORDER — ALBUMIN HUMAN 5 % IV SOLN
INTRAVENOUS | Status: AC
  Filled 2017-06-01: qty 500

## 2017-06-01 MED ORDER — FENTANYL CITRATE (PF) 100 MCG/2ML IJ SOLN
INTRAMUSCULAR | Status: AC
  Filled 2017-06-01: qty 2

## 2017-06-01 MED ORDER — SODIUM CHLORIDE 0.9 % IV SOLN
INTRAVENOUS | Status: DC | PRN
  Administered 2017-06-01: 16:00:00 via INTRAVENOUS

## 2017-06-01 MED ORDER — SUGAMMADEX SODIUM 200 MG/2ML IV SOLN
INTRAVENOUS | Status: DC | PRN
  Administered 2017-06-01: 500 mg via INTRAVENOUS

## 2017-06-01 MED ORDER — PROPOFOL 10 MG/ML IV BOLUS
INTRAVENOUS | Status: AC
  Filled 2017-06-01: qty 20

## 2017-06-01 MED ORDER — SUCCINYLCHOLINE CHLORIDE 200 MG/10ML IV SOSY
PREFILLED_SYRINGE | INTRAVENOUS | Status: DC | PRN
  Administered 2017-06-01: 100 mg via INTRAVENOUS

## 2017-06-01 MED ORDER — MIDAZOLAM HCL 2 MG/2ML IJ SOLN
INTRAMUSCULAR | Status: AC
  Filled 2017-06-01: qty 2

## 2017-06-01 MED ORDER — FENTANYL CITRATE (PF) 100 MCG/2ML IJ SOLN
25.0000 ug | INTRAMUSCULAR | Status: DC | PRN
  Administered 2017-06-01: 25 ug via INTRAVENOUS
  Administered 2017-06-01: 50 ug via INTRAVENOUS
  Administered 2017-06-01: 25 ug via INTRAVENOUS

## 2017-06-01 MED ORDER — ROCURONIUM BROMIDE 10 MG/ML (PF) SYRINGE
PREFILLED_SYRINGE | INTRAVENOUS | Status: DC | PRN
  Administered 2017-06-01: 50 mg via INTRAVENOUS

## 2017-06-01 MED ORDER — TRAMADOL HCL 50 MG PO TABS: 50.0000 mg | ORAL_TABLET | Freq: Four times a day (QID) | ORAL | Status: DC | PRN

## 2017-06-01 MED ORDER — ONDANSETRON HCL 4 MG/2ML IJ SOLN
INTRAMUSCULAR | Status: AC
  Filled 2017-06-01: qty 2

## 2017-06-01 MED ORDER — SODIUM CHLORIDE 0.9 % IV SOLN
10.0000 mL/h | Freq: Once | INTRAVENOUS | Status: AC
  Administered 2017-06-01: 10 mL/h via INTRAVENOUS

## 2017-06-01 MED ORDER — LACTATED RINGERS IV SOLN
INTRAVENOUS | Status: DC | PRN
  Administered 2017-06-01: 16:00:00 via INTRAVENOUS

## 2017-06-01 MED ORDER — OXYCODONE HCL 5 MG PO TABS: 5.0000 mg | ORAL_TABLET | Freq: Once | ORAL | Status: DC | PRN

## 2017-06-01 MED ORDER — CEFAZOLIN SODIUM-DEXTROSE 2-4 GM/100ML-% IV SOLN
INTRAVENOUS | Status: AC
  Filled 2017-06-01: qty 100

## 2017-06-01 MED ORDER — ONDANSETRON HCL 4 MG/2ML IJ SOLN
INTRAMUSCULAR | Status: DC | PRN
  Administered 2017-06-01: 4 mg via INTRAVENOUS

## 2017-06-01 MED ORDER — ACETAMINOPHEN 160 MG/5ML PO SOLN: 325.0000 mg | ORAL | Status: DC | PRN

## 2017-06-01 MED ORDER — DOCUSATE SODIUM 100 MG PO CAPS
100.0000 mg | ORAL_CAPSULE | Freq: Two times a day (BID) | ORAL | Status: DC
  Administered 2017-06-01 – 2017-06-02 (×2): 100 mg via ORAL
  Filled 2017-06-01 (×2): qty 1

## 2017-06-01 MED ORDER — POLYETHYLENE GLYCOL 3350 17 G PO PACK: 17.0000 g | PACK | Freq: Every day | ORAL | Status: DC | PRN

## 2017-06-01 MED ORDER — PROPOFOL 10 MG/ML IV BOLUS
INTRAVENOUS | Status: DC | PRN
  Administered 2017-06-01: 100 mg via INTRAVENOUS

## 2017-06-01 MED ORDER — MIDAZOLAM HCL 5 MG/5ML IJ SOLN
INTRAMUSCULAR | Status: DC | PRN
  Administered 2017-06-01: 2 mg via INTRAVENOUS

## 2017-06-01 MED ORDER — ONDANSETRON 4 MG PO TBDP
4.0000 mg | ORAL_TABLET | Freq: Four times a day (QID) | ORAL | Status: DC | PRN
Start: 2017-06-01 — End: 2017-06-02

## 2017-06-01 MED ORDER — ACETAMINOPHEN 500 MG PO TABS
1000.0000 mg | ORAL_TABLET | Freq: Four times a day (QID) | ORAL | Status: DC
  Administered 2017-06-01 – 2017-06-02 (×3): 1000 mg via ORAL
  Filled 2017-06-01 (×3): qty 2

## 2017-06-01 MED ORDER — POTASSIUM CHLORIDE IN NACL 20-0.45 MEQ/L-% IV SOLN
INTRAVENOUS | Status: DC
  Administered 2017-06-01: 20:00:00 via INTRAVENOUS
  Filled 2017-06-01 (×2): qty 1000

## 2017-06-01 MED ORDER — PHENYLEPHRINE HCL 10 MG/ML IJ SOLN
INTRAVENOUS | Status: DC | PRN
  Administered 2017-06-01: 20 ug/min via INTRAVENOUS

## 2017-06-01 MED ORDER — FENTANYL CITRATE (PF) 100 MCG/2ML IJ SOLN
INTRAMUSCULAR | Status: DC | PRN
  Administered 2017-06-01: 100 ug via INTRAVENOUS

## 2017-06-01 MED ORDER — OXYCODONE HCL 5 MG/5ML PO SOLN
5.0000 mg | Freq: Once | ORAL | Status: DC | PRN
  Filled 2017-06-01: qty 5

## 2017-06-01 MED ORDER — ONDANSETRON HCL 4 MG/2ML IJ SOLN
4.0000 mg | Freq: Four times a day (QID) | INTRAMUSCULAR | Status: DC | PRN
Start: 2017-06-01 — End: 2017-06-02

## 2017-06-01 MED ORDER — OXYCODONE HCL 5 MG PO TABS
5.0000 mg | ORAL_TABLET | ORAL | Status: DC | PRN
  Administered 2017-06-01: 5 mg via ORAL
  Administered 2017-06-01 – 2017-06-02 (×2): 10 mg via ORAL
  Filled 2017-06-01: qty 2
  Filled 2017-06-01: qty 1
  Filled 2017-06-01: qty 2

## 2017-06-01 MED ORDER — PHENYLEPHRINE 40 MCG/ML (10ML) SYRINGE FOR IV PUSH (FOR BLOOD PRESSURE SUPPORT)
PREFILLED_SYRINGE | INTRAVENOUS | Status: DC | PRN
  Administered 2017-06-01: 80 ug via INTRAVENOUS

## 2017-06-01 MED ORDER — HYDROMORPHONE HCL 1 MG/ML IJ SOLN
0.5000 mg | INTRAMUSCULAR | Status: DC | PRN
  Administered 2017-06-01: 0.5 mg via INTRAVENOUS
  Filled 2017-06-01: qty 1

## 2017-06-01 MED ORDER — PHENYLEPHRINE HCL 10 MG/ML IJ SOLN
INTRAMUSCULAR | Status: DC | PRN
  Administered 2017-06-01: 80 ug via INTRAVENOUS

## 2017-06-01 MED ORDER — ACETAMINOPHEN 325 MG PO TABS: 325.0000 mg | ORAL_TABLET | ORAL | Status: DC | PRN

## 2017-06-01 SURGICAL SUPPLY — 41 items
BLADE HEX COATED 2.75 (ELECTRODE) IMPLANT
BLADE SURG 15 STRL LF DISP TIS (BLADE) IMPLANT
BLADE SURG 15 STRL SS (BLADE)
BNDG GAUZE ELAST 4 BULKY (GAUZE/BANDAGES/DRESSINGS) ×2 IMPLANT
CANNULA VESSEL W/WING WO/VALVE (CANNULA) ×2 IMPLANT
COVER SURGICAL LIGHT HANDLE (MISCELLANEOUS) ×2 IMPLANT
DECANTER SPIKE VIAL GLASS SM (MISCELLANEOUS) ×2 IMPLANT
DRAPE SHEET LG 3/4 BI-LAMINATE (DRAPES) ×2 IMPLANT
DRSG PAD ABDOMINAL 8X10 ST (GAUZE/BANDAGES/DRESSINGS) IMPLANT
ELECT PENCIL ROCKER SW 15FT (MISCELLANEOUS) IMPLANT
ELECT REM PT RETURN 15FT ADLT (MISCELLANEOUS) ×2 IMPLANT
GAUZE SPONGE 4X4 12PLY STRL (GAUZE/BANDAGES/DRESSINGS) ×2 IMPLANT
GAUZE SPONGE 4X4 16PLY XRAY LF (GAUZE/BANDAGES/DRESSINGS) ×2 IMPLANT
GLOVE BIOGEL PI IND STRL 7.0 (GLOVE) ×1 IMPLANT
GLOVE BIOGEL PI INDICATOR 7.0 (GLOVE) ×1
GLOVE SURG SIGNA 7.5 PF LTX (GLOVE) ×2 IMPLANT
GOWN SPEC L4 XLG W/TWL (GOWN DISPOSABLE) ×2 IMPLANT
GOWN STRL REUS W/ TWL XL LVL3 (GOWN DISPOSABLE) ×3 IMPLANT
GOWN STRL REUS W/TWL LRG LVL3 (GOWN DISPOSABLE) ×2 IMPLANT
GOWN STRL REUS W/TWL XL LVL3 (GOWN DISPOSABLE) ×3
KIT BASIN OR (CUSTOM PROCEDURE TRAY) ×2 IMPLANT
LUBRICANT JELLY K Y 4OZ (MISCELLANEOUS) ×2 IMPLANT
NDL SAFETY ECLIPSE 18X1.5 (NEEDLE) IMPLANT
NEEDLE HYPO 18GX1.5 SHARP (NEEDLE)
NEEDLE HYPO 25X1 1.5 SAFETY (NEEDLE) IMPLANT
NS IRRIG 1000ML POUR BTL (IV SOLUTION) ×2 IMPLANT
PACK LITHOTOMY IV (CUSTOM PROCEDURE TRAY) ×2 IMPLANT
PAD ABD 7.5X8 STRL (GAUZE/BANDAGES/DRESSINGS) ×2 IMPLANT
SPONGE SURGIFOAM ABS GEL 100 (HEMOSTASIS) ×2 IMPLANT
SUT CHROMIC 2 0 SH (SUTURE) IMPLANT
SUT CHROMIC 3 0 SH 27 (SUTURE) IMPLANT
SUT VIC AB 3-0 SH 18 (SUTURE) ×2 IMPLANT
SYR 50ML LL SCALE MARK (SYRINGE) ×2 IMPLANT
SYR BULB IRRIGATION 50ML (SYRINGE) IMPLANT
SYR CONTROL 10ML LL (SYRINGE) ×2 IMPLANT
TOWEL OR 17X26 10 PK STRL BLUE (TOWEL DISPOSABLE) ×2 IMPLANT
TRAP SPECIMEN MUCOUS 40CC (MISCELLANEOUS) IMPLANT
TUBING CONNECTING 10 (TUBING) ×2 IMPLANT
UNDERPAD 30X30 (UNDERPADS AND DIAPERS) ×4 IMPLANT
WATER STERILE IRR 1000ML POUR (IV SOLUTION) ×2 IMPLANT
YANKAUER SUCT BULB TIP 10FT TU (MISCELLANEOUS) ×2 IMPLANT

## 2017-06-01 NOTE — ED Notes (Signed)
Patient bleeding uncontrollably from anus. ED provider, 2 nurses, and tech at the bedside to attempt to control bleeding.

## 2017-06-01 NOTE — ED Triage Notes (Signed)
[facility]. Pt soaked 4 abd pads in 20 minutes per EMS. Patient denies straining when using the restroom. He had anal fistulotomy last Thursday at [facility].

## 2017-06-01 NOTE — Anesthesia Procedure Notes (Signed)
Procedure Name: Intubation Date/Time: 06/01/2017 3:40 PM Performed by: Victoriano Lain, CRNA Pre-anesthesia Checklist: Patient identified, Emergency Drugs available, Suction available, Patient being monitored and Timeout performed Patient Re-evaluated:Patient Re-evaluated prior to induction Oxygen Delivery Method: Circle system utilized Preoxygenation: Pre-oxygenation with 100% oxygen Induction Type: Rapid sequence and Cricoid Pressure applied Laryngoscope Size: Mac and 4 Grade View: Grade I Tube type: Oral Number of attempts: 1 Airway Equipment and Method: Stylet Placement Confirmation: ETT inserted through vocal cords under direct vision,  positive ETCO2 and breath sounds checked- equal and bilateral Secured at: 22 cm Tube secured with: Tape Dental Injury: Teeth and Oropharynx as per pre-operative assessment

## 2017-06-01 NOTE — ED Notes (Signed)
Surgery and ED provider at the bedside.

## 2017-06-01 NOTE — Anesthesia Postprocedure Evaluation (Signed)
Anesthesia Post Note  Patient: Andrew Foley  Procedure(s) Performed: EXAM UNDER ANESTHESIA (N/A Anus)     Patient location during evaluation: PACU Anesthesia Type: General Level of consciousness: awake and alert Pain management: pain level controlled Vital Signs Assessment: post-procedure vital signs reviewed and stable Respiratory status: spontaneous breathing, nonlabored ventilation, respiratory function stable and patient connected to nasal cannula oxygen Cardiovascular status: stable Postop Assessment: no apparent nausea or vomiting Anesthetic complications: no    Last Vitals:  Vitals:   06/01/17 1745 06/01/17 1752  BP:    Pulse:    Resp: 12 10  Temp:    SpO2:      Last Pain:  Vitals:   06/01/17 1752  PainSc: Asleep                 [name]

## 2017-06-01 NOTE — Transfer of Care (Signed)
Immediate Anesthesia Transfer of Care Note  Patient: Andrew Foley  Procedure(s) Performed: Francia GreavesEXAM UNDER ANESTHESIA (N/A Anus)  Patient Location: PACU  Anesthesia Type:General  Level of Consciousness: awake, alert , oriented and patient cooperative  Airway & Oxygen Therapy: Patient Spontanous Breathing and Patient connected to face mask oxygen  Post-op Assessment: Report given to RN, Post -op Vital signs reviewed and stable and Patient moving all extremities  Post vital signs: Reviewed and stable  Last Vitals:  Vitals:   06/01/17 1513 06/01/17 1519  BP: (!) 85/66 (!) 85/64  Pulse: (!) 133 (!) 125  Resp: 13 12  Temp:    SpO2: 100% 100%    Last Pain:  Vitals:   06/01/17 1347  PainSc: 0-No pain         Complications: No apparent anesthesia complications

## 2017-06-01 NOTE — Brief Op Note (Addendum)
06/01/2017  4:32 PM  PATIENT:  Andrew Foley  37 y.o. male  PRE-OPERATIVE DIAGNOSIS:  rectal bleed  POST-OPERATIVE DIAGNOSIS:  rectal bleeding   PROCEDURE:  Procedure(s): EXAM UNDER ANESTHESIA (N/A)  SURGEON:  Surgeon(s) and Role:    * Kinsinger, De BlanchLuke Aaron, MD - Primary   PHYSICIAN ASSISTANT: Wells GuilesKelly Rayburn PA-C  ASSISTANTS: OR staff   ANESTHESIA:   general  EBL:  minimal  BLOOD ADMINISTERED: 2 units PRBC  DRAINS: none   LOCAL MEDICATIONS USED:  NONE  SPECIMEN:  No Specimen  DISPOSITION OF SPECIMEN:  N/A  COUNTS:  YES  TOURNIQUET:  * No tourniquets in log *  DICTATION: formal Op note pending  PLAN OF CARE: Admit for overnight observation  PATIENT DISPOSITION:  PACU - hemodynamically stable.   Delay start of Pharmacological VTE agent (>24hrs) due to surgical blood loss or risk of bleeding: yes  Wells GuilesKelly Rayburn , [facility] 06/01/2017, 4:35 PM Pager: [phone]  Agree with above. The management entirely by Dr. Sheliah HatchKinsinger. The patient looks good in the recovery room.  Ovidio Kinavid Felita Bump, MD, [facility] Pager: [phone] Office phone:  [phone]

## 2017-06-01 NOTE — ED Provider Notes (Signed)
[facility] Provider Note   CSN: [id number] Arrival date & time: 06/01/17  1336     History   Chief Complaint Chief Complaint  Patient presents with  . Rectal Bleeding    HPI Andrew Foley is a 37 y.o. male.  HPI Patient presents from home with bleeding from his surgical site with recent anal fistulectomy.  States that today began to have bleeding.  Has been only soft watery bowel movements before this but today started having a large amount of blood.  Feels a little lightheaded.   Past Medical History:  Diagnosis Date  . Abscess   . Hemorrhoids   . Rectal pain   . Rheumatic fever    as a child    Patient Active Problem List   Diagnosis Date Noted  . Rectal bleeding 06/01/2017    Past Surgical History:  Procedure Laterality Date  . ADENOIDECTOMY    . PILONIDAL CYST EXCISION         Home Medications    Prior to Admission medications   Medication Sig Start Date End Date Taking? Authorizing Provider  diazepam (VALIUM) 5 MG tablet Take 5 mg by mouth every 6 (six) hours as needed for anxiety. 05/24/17  Yes [provider]  diclofenac sodium (VOLTAREN) 1 % GEL Apply 2 g topically 3 (three) times daily. 05/30/17  Yes [provider]  ibuprofen (ADVIL,MOTRIN) 200 MG tablet Take 400 mg by mouth every 6 (six) hours as needed for headache, mild pain or moderate pain.    Yes [provider]  lidocaine (XYLOCAINE) 5 % ointment Apply 1 application topically 3 (three) times daily as needed for pain. 05/30/17  Yes [provider]  oxyCODONE-acetaminophen (PERCOCET/ROXICET) 5-325 MG tablet Take 1 tablet by mouth every 4 (four) hours as needed for pain. 05/24/17  Yes [provider]    Family History Family History  Problem Relation Age of Onset  . Hyperlipidemia Father   . Hyperlipidemia Brother     Social History Social History   Tobacco Use  . Smoking status: Current Every Day Smoker    Types: Cigars    . Smokeless tobacco: Never Used  Substance Use Topics  . Alcohol use: Yes    Alcohol/week: 12.0 oz    Types: 20 Cans of beer per week  . Drug use: No     Allergies   Codeine   Review of Systems Review of Systems  Constitutional: Negative for fatigue and fever.  HENT: Negative for congestion.   Respiratory: Negative for shortness of breath.   Gastrointestinal: Negative for abdominal pain.  Genitourinary: Negative for flank pain.  Musculoskeletal: Negative for arthralgias.  Skin: Positive for pallor.  Neurological: Positive for light-headedness.  Hematological: Does not bruise/bleed easily.     Physical Exam Updated Vital Signs BP (!) 85/64   Pulse (!) 125   Temp 98 F (36.7 C)   Resp 12   Ht 6' (1.829 m)   Wt 108 kg (238 lb)   SpO2 100%   BMI 32.28 kg/m   Physical Exam  Constitutional: He appears well-developed.  Eyes: Pupils are equal, round, and reactive to light.  Neck: Neck supple.  Cardiovascular:  Mild tachycardia  Pulmonary/Chest: Effort normal.  Abdominal: There is no tenderness.  Genitourinary:  Genitourinary Comments:  postsurgical wound left buttock.  Active bleeding in a stream and around 2 mm wide.  Second wound more medially.  Initially no bleeding from this.  Musculoskeletal: He exhibits no edema.  Neurological: He is alert.  Skin: There is pallor.     ED Treatments / Results  Labs (all labs ordered are listed, but only abnormal results are displayed) Labs Reviewed  I-STAT CHEM 8, ED - Abnormal; Notable for the following components:      Result Value   Glucose, Bld 105 (*)    All other components within normal limits  CBC WITH DIFFERENTIAL/PLATELET  PROTIME-INR  CBC  TYPE AND SCREEN  PREPARE RBC (CROSSMATCH)  ABO/RH    EKG  EKG Interpretation None       Radiology No results found.  Procedures Procedures (including critical care time)  Medications Ordered in ED Medications  fentaNYL (SUBLIMAZE) injection 25-50 mcg  (not administered)  oxyCODONE (Oxy IR/ROXICODONE) immediate release tablet 5 mg (not administered)    Or  oxyCODONE (ROXICODONE) 5 MG/5ML solution 5 mg (not administered)  acetaminophen (TYLENOL) tablet 325-650 mg (not administered)    Or  acetaminophen (TYLENOL) solution 325-650 mg (not administered)  ceFAZolin (ANCEF) 2-4 GM/100ML-% IVPB (not administered)  fentaNYL (SUBLIMAZE) 100 MCG/2ML injection (not administered)  fentaNYL (SUBLIMAZE) 100 MCG/2ML injection (not administered)  albumin human 5 % solution (not administered)  albumin human 5 % solution 12.5 g (not administered)  albumin human 5 % solution 12.5 g (not administered)  0.9 %  sodium chloride infusion (10 mL/hr Intravenous New Bag/Given 06/01/17 1520)     Initial Impression / Assessment and Plan / ED Course  I have reviewed the triage vital signs and the nursing notes.  Pertinent labs & imaging results that were available during my care of the patient were reviewed by me and considered in my medical decision making (see chart for details).     Patient with postoperative bleeding.  Initially had one active likely large venous bleeding.  Direct pressure however was not able to control this.  There was initially decreased bleeding at that site but then increased bleeding through the second wound.  Patient's heart rate increased up to 150.  Became more hypotensive.  Emergency release blood given.  Initially discussed with nurse in the operating room.  1 of the surgery partners can come down because Dr. Ezzard StandingNewman is operating.  However later had not seen anyone.  Second call done OR was prepped.  Continued bleeding despite combat gauze and direct pressure.  Admit to operating room.  Seen in the ER by general surgery.  CRITICAL CARE Performed by: Benjiman CoreNathan Tyniesha Howald Total critical care time: 40 minutes Critical care time was exclusive of separately billable procedures and treating other patients. Critical care was necessary to treat or  prevent imminent or life-threatening deterioration. Critical care was time spent personally by me on the following activities: development of treatment plan with patient and/or surrogate as well as nursing, discussions with consultants, evaluation of patient's response to treatment, examination of patient, obtaining history from patient or surrogate, ordering and performing treatments and interventions, ordering and review of laboratory studies, ordering and review of radiographic studies, pulse oximetry and re-evaluation of patient's condition.   Final Clinical Impressions(s) / ED Diagnoses   Final diagnoses:  Hemorrhagic shock [facility](HCC)    ED Discharge Orders    None       Benjiman CorePickering, Shylo Zamor, MD 06/01/17 1657

## 2017-06-01 NOTE — ED Notes (Signed)
Bed: [id number] Expected date:  Expected time:  Means of arrival:  Comments: 37 yo rectal bleed

## 2017-06-01 NOTE — Op Note (Signed)
Preoperative diagnosis: perianal bleedingin  Postoperative diagnosis: same   Procedure: anal exam under anesthesia, hemostasis of wound  Surgeon: Feliciana RossettiLuke Ondra Deboard, M.D.  Asst: Tresa EndoKelly Rayburn  Anesthesia: General anesthesia  Indications for procedure: Hubbard RobinsonClifton E Stoltz is a 37 y.o. year old male with symptoms of bleeding 6 days after perianal fistula excision. He presented to the ER and attempted pressure without success and was brought emergently to the operating room.  Description of procedure: The patient was brought into the operative suite. Anesthesia was administered with general anesthesia. WHO checklist was applied. The patient was then placed in prone position. The area was prepped and draped in the usual sterile fashion.  By the time the patient was placed into position the bleeding had slowed tremendously. The previous incision was opened by removing the nylon sutures. A large hematoma was removed from the site. There was one deep area of 2 small venous bleeders that stopped with direct cautery. The wound was irrigated, 3 other areas with small ooze were cauterized. Due to the location of the incision, the perianal area was reapposed with 4 interrupted 3-0 vicryl sutures. This left a 5cm opening laterally. The wound was then packed with kerlex and gauze were put in place for dressing. The patient then awoke and was transferred to pacu in stable condition.  Findings: 2 bleeding veins in the deep perianal space  Specimen: none  Implant: kerlex   Blood loss: <7450ml  Local anesthesia: none Complications: none  Feliciana RossettiLuke Elfa Wooton, M.D. General, Bariatric, & Minimally Invasive Surgery [facility]

## 2017-06-01 NOTE — H&P (Signed)
Andrew Foley is an 37 y.o. male.   Chief Complaint: perianal postoperative bleeding HPI: 37 yo male underwent incision and drainage of abscess on 2/21. He was sent home and did fine. However, today while doing a sitz bath had a large amount of blood drain from his wound. The bleeding has not stopped and he came to the ER. He has had no hypotension. He has been taking oxycodone for pain control.  On reviewing the Op note it appears they entered the abscess cavity and found a tract and tracked this down to the sphincters and then removed the cavity and tract in entirety and then closed the skin incision.  Past Medical History:  Diagnosis Date  . Abscess   . Hemorrhoids   . Rectal pain   . Rheumatic fever    as a child    Past Surgical History:  Procedure Laterality Date  . ADENOIDECTOMY    . PILONIDAL CYST EXCISION      Family History  Problem Relation Age of Onset  . Hyperlipidemia Father   . Hyperlipidemia Brother    Social History:  reports that he has been smoking cigars.  he has never used smokeless tobacco. He reports that he drinks about 12.0 oz of alcohol per week. He reports that he does not use drugs.  Allergies:  Allergies  Allergen Reactions  . Codeine      (Not in a hospital admission)  Results for orders placed or performed during the hospital encounter of 06/01/17 (from the past 48 hour(s))  CBC with Differential     Status: None   Collection Time: 06/01/17  1:59 PM  Result Value Ref Range   WBC 7.3 4.0 - 10.5 K/uL   RBC 4.53 4.22 - 5.81 MIL/uL   Hemoglobin 13.8 13.0 - 17.0 g/dL   HCT 16.1 09.6 - 04.5 %   MCV 92.3 78.0 - 100.0 fL   MCH 30.5 26.0 - 34.0 pg   MCHC 33.0 30.0 - 36.0 g/dL   RDW 40.9 81.1 - 91.4 %   Platelets 321 150 - 400 K/uL   Neutrophils Relative % 52 %   Neutro Abs 3.8 1.7 - 7.7 K/uL   Lymphocytes Relative 43 %   Lymphs Abs 3.1 0.7 - 4.0 K/uL   Monocytes Relative 4 %   Monocytes Absolute 0.3 0.1 - 1.0 K/uL   Eosinophils Relative  1 %   Eosinophils Absolute 0.1 0.0 - 0.7 K/uL   Basophils Relative 0 %   Basophils Absolute 0.0 0.0 - 0.1 K/uL    Comment: Performed at [facility], [address] [place], [place] [address]  Type and screen [facility]     Status: None (Preliminary result)   Collection Time: 06/01/17  2:10 PM  Result Value Ref Range   ABO/RH(D) O POS    Antibody Screen PENDING    Sample Expiration      06/04/2017 Performed at [facility], [address], [place], [place] [address]    Unit Number [id number]    Blood Component Type RED CELLS,LR    Unit division 00    Status of Unit ISSUED    Unit tag comment VERBAL ORDERS PER DR PICKERING    Transfusion Status OK TO TRANSFUSE    Crossmatch Result PENDING   I-stat Chem 8, ED     Status: Abnormal   Collection Time: 06/01/17  2:16 PM  Result Value Ref Range   Sodium 140 135 -  145 mmol/L   Potassium 3.8 3.5 - 5.1 mmol/L   Chloride 103 101 - 111 mmol/L   BUN 17 6 - 20 mg/dL   Creatinine, Ser 2.841.20 0.61 - 1.24 mg/dL   Glucose, Bld 132105 (H) 65 - 99 mg/dL   Calcium, Ion 4.401.17 1.021.15 - 1.40 mmol/L   TCO2 26 22 - 32 mmol/L   Hemoglobin 14.6 13.0 - 17.0 g/dL   HCT 72.543.0 36.639.0 - 44.052.0 %  Prepare RBC     Status: None   Collection Time: 06/01/17  3:30 PM  Result Value Ref Range   Order Confirmation      ORDER PROCESSED BY BLOOD BANK Performed at [facility], [address] [place], [place] [address]    No results found.  Review of Systems  Constitutional: Negative for chills and fever.  HENT: Negative for hearing loss.   Eyes: Negative for blurred vision and double vision.  Respiratory: Negative for cough and hemoptysis.   Cardiovascular: Negative for chest pain and palpitations.  Gastrointestinal: Positive for blood in stool. Negative for abdominal pain, nausea and vomiting.  Genitourinary: Negative for dysuria and urgency.  Musculoskeletal: Negative for myalgias and  neck pain.  Skin: Negative for itching and rash.  Neurological: Negative for dizziness, tingling and headaches.  Endo/Heme/Allergies: Does not bruise/bleed easily.  Psychiatric/Behavioral: Negative for depression and suicidal ideas.    Blood pressure 101/62, pulse (!) 136, temperature 98 F (36.7 C), resp. rate 20, height 6' (1.829 m), weight 108 kg (238 lb), SpO2 100 %. Physical Exam  Vitals reviewed. Constitutional: He is oriented to person, place, and time. He appears well-developed and well-nourished.  HENT:  Head: Normocephalic and atraumatic.  Eyes: Conjunctivae and EOM are normal. Pupils are equal, round, and reactive to light.  Neck: Normal range of motion. Neck supple.  Cardiovascular: Normal rate and regular rhythm.  Respiratory: Effort normal and breath sounds normal.  GI: Soft. Bowel sounds are normal. He exhibits no distension. There is no tenderness.  Genitourinary:  Genitourinary Comments: Constant bleeding from the lateral opening on the right buttock, incisions intact in the right perianal space. Able to hold pressure over the bleeding area with no other sites of bleeding and no bleeding from the anus.  Musculoskeletal: Normal range of motion.  Neurological: He is alert and oriented to person, place, and time.  Skin: Skin is warm and dry.  Psychiatric: He has a normal mood and affect. His behavior is normal.     Assessment/Plan 37 yo male with venous bleeding 5 days after perirectal abscess/fistula procedure -continue resuscitation with blood -to operating room for evaluation and hemostasis  Rodman PickleLuke Aaron Jonmichael Beadnell, MD 06/01/2017, 3:19 PM

## 2017-06-01 NOTE — Anesthesia Preprocedure Evaluation (Signed)
Anesthesia Evaluation  Patient identified by MRN, date of birth, ID band Patient awake    Reviewed: Allergy & Precautions, NPO status , Patient's Chart, lab work & pertinent test resultsPreop documentation limited or incomplete due to emergent nature of procedure.  History of Anesthesia Complications Negative for: history of anesthetic complications  Airway Mallampati: II  TM Distance: >3 FB Neck ROM: Full    Dental  (+) Teeth Intact   Pulmonary Current Smoker,    breath sounds clear to auscultation       Cardiovascular negative cardio ROS   Rhythm:Regular     Neuro/Psych negative neurological ROS  negative psych ROS   GI/Hepatic negative GI ROS, Neg liver ROS,   Endo/Other  Morbid obesity  Renal/GU negative Renal ROS     Musculoskeletal   Abdominal   Peds  Hematology Acute blood loss anemia   Anesthesia Other Findings   Reproductive/Obstetrics                             Anesthesia Physical Anesthesia Plan  ASA: II and emergent  Anesthesia Plan: General   Post-op Pain Management:    Induction: Intravenous, Rapid sequence and Cricoid pressure planned  PONV Risk Score and Plan: 1 and Ondansetron  Airway Management Planned: Oral ETT  Additional Equipment: None  Intra-op Plan:   Post-operative Plan: Extubation in OR  Informed Consent: I have reviewed the patients History and Physical, chart, labs and discussed the procedure including the risks, benefits and alternatives for the proposed anesthesia with the patient or authorized representative who has indicated his/her understanding and acceptance.   Dental advisory given  Plan Discussed with: Surgeon and CRNA  Anesthesia Plan Comments:         Anesthesia Quick Evaluation

## 2017-06-02 ENCOUNTER — Encounter (HOSPITAL_COMMUNITY): Payer: Self-pay | Admitting: *Deleted

## 2017-06-02 LAB — COMPREHENSIVE METABOLIC PANEL
ALT: 25 U/L (ref 17–63)
AST: 19 U/L (ref 15–41)
Albumin: 3.5 g/dL (ref 3.5–5.0)
Alkaline Phosphatase: 43 U/L (ref 38–126)
Anion gap: 6 (ref 5–15)
BUN: 13 mg/dL (ref 6–20)
CHLORIDE: 109 mmol/L (ref 101–111)
CO2: 24 mmol/L (ref 22–32)
Calcium: 7.9 mg/dL — ABNORMAL LOW (ref 8.9–10.3)
Creatinine, Ser: 1.16 mg/dL (ref 0.61–1.24)
GFR calc Af Amer: >60 mL/min (ref >60)
GFR calc non Af Amer: >60 mL/min (ref >60)
GLUCOSE: 133 mg/dL — AB (ref 65–99)
Potassium: 4.3 mmol/L (ref 3.5–5.1)
SODIUM: 139 mmol/L (ref 135–145)
Total Bilirubin: 1.3 mg/dL — ABNORMAL HIGH (ref 0.3–1.2)
Total Protein: 5.8 g/dL — ABNORMAL LOW (ref 6.5–8.1)

## 2017-06-02 LAB — CBC
HCT: 27.5 % — ABNORMAL LOW (ref 39.0–52.0)
Hemoglobin: 9.1 g/dL — ABNORMAL LOW (ref 13.0–17.0)
MCH: 29.8 pg (ref 26.0–34.0)
MCHC: 33.1 g/dL (ref 30.0–36.0)
MCV: 90.2 fL (ref 78.0–100.0)
PLATELETS: 173 10*3/uL (ref 150–400)
RBC: 3.05 MIL/uL — AB (ref 4.22–5.81)
RDW: 14 % (ref 11.5–15.5)
WBC: 6.3 10*3/uL (ref 4.0–10.5)

## 2017-06-02 LAB — PROTIME-INR
INR: 1.17
Prothrombin Time: 14.8 seconds (ref 11.4–15.2)

## 2017-06-02 LAB — HIV ANTIBODY (ROUTINE TESTING W REFLEX): HIV Screen 4th Generation wRfx: NONREACTIVE

## 2017-06-02 NOTE — Progress Notes (Signed)
Discharge instructions given to patient and patients wife, all questions answered at this time.  Pt. VSS with no s/s of distress noted.  Patient stable at discharge.  

## 2017-06-02 NOTE — Discharge Summary (Signed)
  Patient ID: Andrew Foley [id number] 37 y.o. [birth year]  06/01/2017  Discharge date and time: 06/02/2017   Admitting Physician: Andrew RossettiLuke Foley  Discharge Physician: Andrew Foley  Admission Diagnoses: Postoperative rectal bleeding  Discharge Diagnoses: Same  Operations: Procedure(s): EXAM UNDER ANESTHESIA with evacuation of hematoma and control of bleeding  Admission Condition: fair  Discharged Condition: good  Indication for Admission: Patient presented to the emergency department with ongoing significant rectal bleeding 6 days following fistulectomy performed in [place].    Hospital Course: Bleeding could not be controlled with pressure and local measures in the emergency department and he was taken emergently to the operating room and under general anesthesia underwent exploration of his wound with evacuation of hematoma and control of bleeding.  The wound was partially reclosed internally as it had been.  Tolerated procedure well.  He had no further bleeding postoperatively.  The following morning he is comfortable.  Wound packing removed and the wound is clean and without bleeding.  It was redressed.  He is comfortable with discharge.  He will continue sits baths at home as previously and follow-up with his surgeon in 48 hours.    Disposition: Home  Patient Instructions:  Allergies as of 06/02/2017      Reactions   Codeine       Medication List    TAKE these medications   diazepam 5 MG tablet Commonly known as:  VALIUM Take 5 mg by mouth every 6 (six) hours as needed for anxiety.   diclofenac sodium 1 % Gel Commonly known as:  VOLTAREN Apply 2 g topically 3 (three) times daily.   ibuprofen 200 MG tablet Commonly known as:  ADVIL,MOTRIN Take 400 mg by mouth every 6 (six) hours as needed for headache, mild pain or moderate pain.   lidocaine 5 % ointment Commonly known as:  XYLOCAINE Apply 1 application topically 3 (three) times daily as needed for  pain.   oxyCODONE-acetaminophen 5-325 MG tablet Commonly known as:  PERCOCET/ROXICET Take 1 tablet by mouth every 4 (four) hours as needed for pain.       Activity: activity as tolerated Diet: regular diet Wound Care: Tomorrow begin daily sits baths and after each bowel movement  Follow-up:  With his primary surgeon in [place] in 2 days.  Signed: Mariella SaaBenjamin T Breea Loncar MD, FACS  06/02/2017, 8:11 AM

## 2017-06-02 NOTE — Discharge Instructions (Signed)
Begin sits baths in 24 hours daily and after each bowel movement.

## 2017-06-02 NOTE — Progress Notes (Signed)
Patient ID: Andrew Foley Andrew Foley, male   DOB: 25-Nov-1980, 37 y.o.   MRN: [id number] 1 Day Post-Op   Subjective: Did well overnight.  Minimal discomfort.  No further bleeding.  Objective: Vital signs in last 24 hours: Temp:  [97.5 F (36.4 C)-98.4 F (36.9 C)] 98.4 F (36.9 C) (03/02 0757) Pulse Rate:  [62-136] 69 (03/02 0600) Resp:  [10-20] 17 (03/02 0600) BP: (78-116)/(49-94) 107/53 (03/02 0600) SpO2:  [94 %-100 %] 99 % (03/02 0600) FiO2 (%):  [97.5 %] 97.5 % (03/01 1752) Weight:  [108 kg (238 lb)-111.5 kg (245 lb 13 oz)] 111.5 kg (245 lb 13 oz) (03/01 1816)    Intake/Output from previous day: 03/01 0701 - 03/02 0700 In: 4177.5 [I.V.:3562.5; Blood:315; IV Piggyback:300] Out: 600 [Urine:500; Blood:100] Intake/Output this shift: No intake/output data recorded.  General appearance: alert, cooperative and no distress Incision/Wound: Packing removed.  Wound is clean.  No bleeding.  Lab Results:  Recent Labs    06/01/17 2149 06/02/17 0327  WBC 9.9 6.3  HGB 10.0* 9.1*  HCT 30.6* 27.5*  PLT 176 173   BMET Recent Labs    06/01/17 1416 06/02/17 0327  NA 140 139  K 3.8 4.3  CL 103 109  CO2  --  24  GLUCOSE 105* 133*  BUN 17 13  CREATININE 1.20 1.16  CALCIUM  --  7.9*     Studies/Results: No results found.  Anti-infectives: Anti-infectives (From admission, onward)   Start     Dose/Rate Route Frequency Ordered Stop   06/01/17 1556  ceFAZolin (ANCEF) 2-4 GM/100ML-% IVPB    Comments:  [name], [name]   : cabinet override      06/01/17 1556 06/02/17 0414      Assessment/Plan: s/p Procedure(s): EXAM UNDER ANESTHESIA evacuation of hematoma and control of bleeding Doing well today with no further bleeding.  Okay for discharge.  He will follow-[place] in 2 days [place] [place] [place].    LOS: 1 day    Mariella SaaBenjamin T Skylor Hughson 06/02/2017

## 2017-06-04 NOTE — Addendum Note (Signed)
Addendum  created 06/04/17 0600 by Elisabeth CaraArmistead, Anndee Connett A, CRNA   Charge Capture section accepted, Visit diagnoses modified

## 2017-06-05 LAB — TYPE AND SCREEN
ABO/RH(D): O POS
ANTIBODY SCREEN: NEGATIVE
UNIT DIVISION: 0
UNIT DIVISION: 0
UNIT DIVISION: 0
Unit division: 0
Unit division: 0
Unit division: 0
Unit division: 0

## 2017-06-05 LAB — BPAM RBC
BLOOD PRODUCT EXPIRATION DATE: 201903282359
BLOOD PRODUCT EXPIRATION DATE: 201903312359
BLOOD PRODUCT EXPIRATION DATE: 201904012359
BLOOD PRODUCT EXPIRATION DATE: 201904012359
Blood Product Expiration Date: 201903312359
Blood Product Expiration Date: 201903312359
Blood Product Expiration Date: 201904012359
ISSUE DATE / TIME: 201903011456
ISSUE DATE / TIME: 201903011520
ISSUE DATE / TIME: 201903011520
ISSUE DATE / TIME: 201903011520
ISSUE DATE / TIME: 201903011520
ISSUE DATE / TIME: 201903021054
ISSUE DATE / TIME: 201903021733
UNIT TYPE AND RH: 5100
UNIT TYPE AND RH: 5100
UNIT TYPE AND RH: 5100
UNIT TYPE AND RH: 5100
UNIT TYPE AND RH: 9500
Unit Type and Rh: 5100
Unit Type and Rh: 5100

## 2017-06-19 ENCOUNTER — Encounter: Payer: Self-pay | Admitting: Family Medicine

## 2017-06-19 ENCOUNTER — Other Ambulatory Visit: Payer: Self-pay

## 2017-06-19 ENCOUNTER — Ambulatory Visit (INDEPENDENT_AMBULATORY_CARE_PROVIDER_SITE_OTHER): Payer: PRIVATE HEALTH INSURANCE | Admitting: Family Medicine

## 2017-06-19 VITALS — BP 110/82 | HR 68 | Temp 98.8°F | Resp 16 | Ht 70.87 in | Wt 227.0 lb

## 2017-06-19 DIAGNOSIS — M109 Gout, unspecified: Secondary | ICD-10-CM

## 2017-06-19 DIAGNOSIS — K610 Anal abscess: Secondary | ICD-10-CM | POA: Diagnosis not present

## 2017-06-19 MED ORDER — COLCHICINE 0.6 MG PO TABS: ORAL_TABLET | ORAL | 0 refills | Status: DC

## 2017-06-19 NOTE — Progress Notes (Signed)
Subjective:  By signing my name below, I, [name], attest that this documentation has been prepared under the direction and in the presence of [name], MD Electronically Signed: Charline Bills, ED Scribe 06/19/2017 at 11:02 AM.   Patient ID: Andrew Foley, male    DOB: 05-25-1980, 37 y.o.   MRN: [id number]  Chief Complaint  Patient presents with  . Establish Care    pt needs to est with a PCP  . Gout    pt states after seeing his foot doctor he was informed his Uric Acid was high and may need to be checked for gout in the left foot.    HPI Andrew OHLIN is a 37 y.o. male who presents to Primary Care at [facility] to establish care. Has been treated by [facility] surgical specialist [facility] for perianal abscess s/p fistulectomy and incision of anal abscess ~ 3 wks ago. Seen in f/u yesterday and was doing well. Planned for f/u in 1 month. He was admitted 2/1-2 for post-op rectal bleeding and underwent emergent evacuation of hematoma and bleeding control on 3/1. Last CPE in July 2018 with Dr. Everlene Other.  Immunizations Immunization History  Administered Date(s) Administered  . Pneumococcal Polysaccharide-23 10/16/2016  Due for tdap.  Possible Gout Has been evaluated by Dr. Renaye Rakers with elevated uric acid. Concern for possible gout. Pt noticed swelling, pain and warmth to the L great toe on 3/3. He had fluid drawn off the L great toe and steroid injected into the toe on 3/6. Uric acid was elevated at that time and pt was advised to be checked for gout. No previous h/o gout. States he still has some soreness and swelling to the L great toe at this time. Denies alcohol intake prior to flare but states he was on a high fiber diet.  Multiple previous outside records reviewed.  Patient Active Problem List   Diagnosis Date Noted  . Rectal bleeding s/p EUA/ligation 06/01/2017 06/01/2017  . Postoperative hemorrhagic shock from OSH surgery 06/01/2017  . Perianal abscess 05/16/2017    Past Medical History:  Diagnosis Date  . Abscess   . Hemorrhoids   . Rectal pain   . Rheumatic fever    as a child   Past Surgical History:  Procedure Laterality Date  . ADENOIDECTOMY    . ANAL EXAMINATION UNDER ANESTHESIA  06/01/2017   Dr Sheliah Hatch.  Brisk anal bleeding - cautery & suture control  . INCISION AND DRAINAGE ABSCESS ANAL  05/25/2017   [place].  Dr Albesa Seen.  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 PILONIDAL CYST EXCISION     Allergies  Allergen Reactions  . Codeine    Prior to Admission medications   Medication Sig Start Date End Date Taking? Authorizing Provider  diazepam (VALIUM) 5 MG tablet Take 5 mg by mouth every 6 (six) hours as needed for anxiety. 05/24/17   [provider]  diclofenac sodium (VOLTAREN) 1 % GEL Apply 2 g topically 3 (three) times daily. 05/30/17   [provider]  ibuprofen (ADVIL,MOTRIN) 200 MG tablet Take 400 mg by mouth every 6 (six) hours as needed for headache, mild pain or moderate pain.     [provider]  lidocaine (XYLOCAINE) 5 % ointment Apply 1 application topically 3 (three) times daily as needed for pain. 05/30/17   [provider]  oxyCODONE-acetaminophen (PERCOCET/ROXICET) 5-325 MG tablet Take 1 tablet by mouth every  4 (four) hours as needed for pain. 05/24/17   [provider]   Social History   Socioeconomic History  . Marital status: Married    Spouse name: [name]  . Number of children: 3  . Years of education: Not on file  . Highest education level: High school graduate  Social Needs  . Financial resource strain: Somewhat hard  . Food insecurity - worry: Sometimes true  . Food insecurity - inability: Never true  . Transportation needs - medical: No  . Transportation needs - non-medical: No  Occupational History  . Not on file  Tobacco Use  . Smoking status: Light Tobacco Smoker    Types: Cigars    . Smokeless tobacco: Never Used  . Tobacco comment: 2 cigars a week  Substance and Sexual Activity  . Alcohol use: Yes    Alcohol/week: 12.0 oz    Types: 20 Cans of beer per week  . Drug use: No  . Sexual activity: Yes    Birth control/protection: None  Other Topics Concern  . Not on file  Social History Narrative  . Not on file   Review of Systems  Musculoskeletal: Positive for arthralgias and joint swelling.      Objective:   Physical Exam  Constitutional: He is oriented to person, place, and time. He appears well-developed and well-nourished. No distress.  HENT:  Head: Normocephalic and atraumatic.  Eyes: Conjunctivae and EOM are normal.  Neck: Neck supple. No tracheal deviation present.  Cardiovascular: Normal rate.  Pulmonary/Chest: Effort normal. No respiratory distress.  Musculoskeletal: Normal range of motion.  L 1st MTP: no significant warmth or erythema. Possible mild soft tissue swelling. Slight discomfort with motion and palpation of MTP. NVI distally.  Neurological: He is alert and oriented to person, place, and time.  Skin: Skin is warm and dry.  Psychiatric: He has a normal mood and affect. His behavior is normal.  Nursing note and vitals reviewed.  Vitals:   06/19/17 1037  BP: 110/82  Pulse: 68  Resp: 16  Temp: 98.8 F (37.1 C)  TempSrc: Oral  SpO2: 100%  Weight: 227 lb (103 kg)  Height: 5' 10.87" (1.8 m)      Assessment & Plan:    Andrew Foley is a 37 y.o. male Podagra - Plan: Uric Acid, colchicine 0.6 MG tablet  - suspected gout by history/exam. No prior sx's. Improving.   - requested labs/note from ortho  -check uric acid, Rx for colchicine if recurrence.  Hold on daily meds at this point.  Side effects discussed. RTC precautions.   Perianal abscess  - improving after initial bleeding requiring emergency surgery as above. ER precautions if worsening symptoms, and continue follow up as planned with surgeon.   Plan on CPE in next 4  months.   Meds ordered this encounter  Medications  . colchicine 0.6 MG tablet    Sig: 2 tabs at onset of symptoms, followed by 1 additional tablet in 1 hour if needed.    Dispense:  15 tablet    Refill:  0   Patient Instructions    Toe pain/swelling was likely gout. That should continue to improve. If you have another flair - take colchicine as discussed. I will check uric acid test today. See info below and follow up for physical in July. Let me know if there are questions prior to that visit.   Keep follow up as planned with surgeon for the perianal abscess. I am glad to hear that  it is improving. Return to the clinic or go to the nearest emergency room if any of your symptoms worsen or new symptoms occur.   Gout Gout is painful swelling that can occur in some of your joints. Gout is a type of arthritis. This condition is caused by having too much uric acid in your body. Uric acid is a chemical that forms when your body breaks down substances called purines. Purines are important for building body proteins. When your body has too much uric acid, sharp crystals can form and build up inside your joints. This causes pain and swelling. Gout attacks can happen quickly and be very painful (acute gout). Over time, the attacks can affect more joints and become more frequent (chronic gout). Gout can also cause uric acid to build up under your skin and inside your kidneys. What are the causes? This condition is caused by too much uric acid in your blood. This can occur because:  Your kidneys do not remove enough uric acid from your blood. This is the most common cause.  Your body makes too much uric acid. This can occur with some cancers and cancer treatments. It can also occur if your body is breaking down too many red blood cells (hemolytic anemia).  You eat too many foods that are high in purines. These foods include organ meats and some seafood. Alcohol, especially beer, is also high in  purines.  A gout attack may be triggered by trauma or stress. What increases the risk? This condition is more likely to develop in people who:  Have a family history of gout.  Are male and middle-aged.  Are male and have gone through menopause.  Are obese.  Frequently drink alcohol, especially beer.  Are dehydrated.  Lose weight too quickly.  Have an organ transplant.  Have lead poisoning.  Take certain medicines, including aspirin, cyclosporine, diuretics, levodopa, and niacin.  Have kidney disease or psoriasis.  What are the signs or symptoms? An attack of acute gout happens quickly. It usually occurs in just one joint. The most common place is the big toe. Attacks often start at night. Other joints that may be affected include joints of the feet, ankle, knee, fingers, wrist, or elbow. Symptoms may include:  Severe pain.  Warmth.  Swelling.  Stiffness.  Tenderness. The affected joint may be very painful to touch.  Shiny, red, or purple skin.  Chills and fever.  Chronic gout may cause symptoms more frequently. More joints may be involved. You may also have white or yellow lumps (tophi) on your hands or feet or in other areas near your joints. How is this diagnosed? This condition is diagnosed based on your symptoms, medical history, and physical exam. You may have tests, such as:  Blood tests to measure uric acid levels.  Removal of joint fluid with a needle (aspiration) to look for uric acid crystals.  X-rays to look for joint damage.  How is this treated? Treatment for this condition has two phases: treating an acute attack and preventing future attacks. Acute gout treatment may include medicines to reduce pain and swelling, including:  NSAIDs.  Steroids. These are strong anti-inflammatory medicines that can be taken by mouth (orally) or injected into a joint.  Colchicine. This medicine relieves pain and swelling when it is taken soon after an  attack. It can be given orally or through an IV tube.  Preventive treatment may include:  Daily use of smaller doses of NSAIDs or colchicine.  Use  of a medicine that reduces uric acid levels in your blood.  Changes to your diet. You may need to see a specialist about healthy eating (dietitian).  Follow these instructions at home: During a Gout Attack  If directed, apply ice to the affected area: ? Put ice in a plastic bag. ? Place a towel between your skin and the bag. ? Leave the ice on for 20 minutes, 2-3 times a day.  Rest the joint as much as possible. If the affected joint is in your leg, you may be given crutches to use.  Raise (elevate) the affected joint above the level of your heart as often as possible.  Drink enough fluids to keep your urine clear or pale yellow.  Take over-the-counter and prescription medicines only as told by your health care provider.  Do not drive or operate heavy machinery while taking prescription pain medicine.  Follow instructions from your health care provider about eating or drinking restrictions.  Return to your normal activities as told by your health care provider. Ask your health care provider what activities are safe for you. Avoiding Future Gout Attacks  Follow a low-purine diet as told by your dietitian or health care provider. Avoid foods and drinks that are high in purines, including liver, kidney, anchovies, asparagus, herring, mushrooms, mussels, and beer.  Limit alcohol intake to no more than 1 drink a day for nonpregnant women and 2 drinks a day for men. One drink equals 12 oz of beer, 5 oz of wine, or 1 oz of hard liquor.  Maintain a healthy weight or lose weight if you are overweight. If you want to lose weight, talk with your health care provider. It is important that you do not lose weight too quickly.  Start or maintain an exercise program as told by your health care provider.  Drink enough fluids to keep your urine  clear or pale yellow.  Take over-the-counter and prescription medicines only as told by your health care provider.  Keep all follow-up visits as told by your health care provider. This is important. Contact a health care provider if:  You have another gout attack.  You continue to have symptoms of a gout attack after10 days of treatment.  You have side effects from your medicines.  You have chills or a fever.  You have burning pain when you urinate.  You have pain in your lower back or belly. Get help right away if:  You have severe or uncontrolled pain.  You cannot urinate. This information is not intended to replace advice given to you by your health care provider. Make sure you discuss any questions you have with your health care provider. Document Released: 03/17/2000 Document Revised: 08/26/2015 Document Reviewed: 12/31/2014 Elsevier Interactive Patient Education  2018 [employer].    IF you received an x-ray today, you will receive an invoice from [facility]. Please contact [facility] at [phone] with questions or concerns regarding your invoice.   IF you received labwork today, you will receive an invoice from [place]. Please contact [facility] at [phone] with questions or concerns regarding your invoice.   Our billing staff will not be able to assist you with questions regarding bills from these companies.  You will be contacted with the lab results as soon as they are available. The fastest way to get your results is to activate your My Chart account. Instructions are located on the last page of this paperwork. If you have not heard from [place] regarding  the results in 2 weeks, please contact this office.        I personally performed the services described in this documentation, which was scribed in my presence. The recorded information has been reviewed and considered for accuracy and completeness, addended by me as needed, and agree with  information above.  Signed,   [name], MD Primary Care at [facility].  06/19/17 11:21 AM

## 2017-06-19 NOTE — Patient Instructions (Addendum)
Toe pain/swelling was likely gout. That should continue to improve. If you have another flair - take colchicine as discussed. I will check uric acid test today. See info below and follow up for physical in July. Let me know if there are questions prior to that visit.   Keep follow up as planned with surgeon for the perianal abscess. I am glad to hear that it is improving. Return to the clinic or go to the nearest emergency room if any of your symptoms worsen or new symptoms occur.   Gout Gout is painful swelling that can occur in some of your joints. Gout is a type of arthritis. This condition is caused by having too much uric acid in your body. Uric acid is a chemical that forms when your body breaks down substances called purines. Purines are important for building body proteins. When your body has too much uric acid, sharp crystals can form and build up inside your joints. This causes pain and swelling. Gout attacks can happen quickly and be very painful (acute gout). Over time, the attacks can affect more joints and become more frequent (chronic gout). Gout can also cause uric acid to build up under your skin and inside your kidneys. What are the causes? This condition is caused by too much uric acid in your blood. This can occur because:  Your kidneys do not remove enough uric acid from your blood. This is the most common cause.  Your body makes too much uric acid. This can occur with some cancers and cancer treatments. It can also occur if your body is breaking down too many red blood cells (hemolytic anemia).  You eat too many foods that are high in purines. These foods include organ meats and some seafood. Alcohol, especially beer, is also high in purines.  A gout attack may be triggered by trauma or stress. What increases the risk? This condition is more likely to develop in people who:  Have a family history of gout.  Are male and middle-aged.  Are male and have gone through  menopause.  Are obese.  Frequently drink alcohol, especially beer.  Are dehydrated.  Lose weight too quickly.  Have an organ transplant.  Have lead poisoning.  Take certain medicines, including aspirin, cyclosporine, diuretics, levodopa, and niacin.  Have kidney disease or psoriasis.  What are the signs or symptoms? An attack of acute gout happens quickly. It usually occurs in just one joint. The most common place is the big toe. Attacks often start at night. Other joints that may be affected include joints of the feet, ankle, knee, fingers, wrist, or elbow. Symptoms may include:  Severe pain.  Warmth.  Swelling.  Stiffness.  Tenderness. The affected joint may be very painful to touch.  Shiny, red, or purple skin.  Chills and fever.  Chronic gout may cause symptoms more frequently. More joints may be involved. You may also have white or yellow lumps (tophi) on your hands or feet or in other areas near your joints. How is this diagnosed? This condition is diagnosed based on your symptoms, medical history, and physical exam. You may have tests, such as:  Blood tests to measure uric acid levels.  Removal of joint fluid with a needle (aspiration) to look for uric acid crystals.  X-rays to look for joint damage.  How is this treated? Treatment for this condition has two phases: treating an acute attack and preventing future attacks. Acute gout treatment may include medicines to reduce pain and  swelling, including:  NSAIDs.  Steroids. These are strong anti-inflammatory medicines that can be taken by mouth (orally) or injected into a joint.  Colchicine. This medicine relieves pain and swelling when it is taken soon after an attack. It can be given orally or through an IV tube.  Preventive treatment may include:  Daily use of smaller doses of NSAIDs or colchicine.  Use of a medicine that reduces uric acid levels in your blood.  Changes to your diet. You may need to  see a specialist about healthy eating (dietitian).  Follow these instructions at home: During a Gout Attack  If directed, apply ice to the affected area: ? Put ice in a plastic bag. ? Place a towel between your skin and the bag. ? Leave the ice on for 20 minutes, 2-3 times a day.  Rest the joint as much as possible. If the affected joint is in your leg, you may be given crutches to use.  Raise (elevate) the affected joint above the level of your heart as often as possible.  Drink enough fluids to keep your urine clear or pale yellow.  Take over-the-counter and prescription medicines only as told by your health care provider.  Do not drive or operate heavy machinery while taking prescription pain medicine.  Follow instructions from your health care provider about eating or drinking restrictions.  Return to your normal activities as told by your health care provider. Ask your health care provider what activities are safe for you. Avoiding Future Gout Attacks  Follow a low-purine diet as told by your dietitian or health care provider. Avoid foods and drinks that are high in purines, including liver, kidney, anchovies, asparagus, herring, mushrooms, mussels, and beer.  Limit alcohol intake to no more than 1 drink a day for nonpregnant women and 2 drinks a day for men. One drink equals 12 oz of beer, 5 oz of wine, or 1 oz of hard liquor.  Maintain a healthy weight or lose weight if you are overweight. If you want to lose weight, talk with your health care provider. It is important that you do not lose weight too quickly.  Start or maintain an exercise program as told by your health care provider.  Drink enough fluids to keep your urine clear or pale yellow.  Take over-the-counter and prescription medicines only as told by your health care provider.  Keep all follow-up visits as told by your health care provider. This is important. Contact a health care provider if:  You have  another gout attack.  You continue to have symptoms of a gout attack after10 days of treatment.  You have side effects from your medicines.  You have chills or a fever.  You have burning pain when you urinate.  You have pain in your lower back or belly. Get help right away if:  You have severe or uncontrolled pain.  You cannot urinate. This information is not intended to replace advice given to you by your health care provider. Make sure you discuss any questions you have with your health care provider. Document Released: 03/17/2000 Document Revised: 08/26/2015 Document Reviewed: 12/31/2014 Elsevier Interactive Patient Education  2018 [employer]    IF you received an x-ray today, you will receive an invoice from [facility]. Please contact [facility] at [phone] with questions or concerns regarding your invoice.   IF you received labwork today, you will receive an invoice from [facility]. Please contact [facility] at [phone] with questions or concerns regarding your  invoice.   Our billing staff will not be able to assist you with questions regarding bills from these companies.  You will be contacted with the lab results as soon as they are available. The fastest way to get your results is to activate your My Chart account. Instructions are located on the last page of this paperwork. If you have not heard from [place] regarding the results in 2 weeks, please contact this office.

## 2017-06-20 LAB — URIC ACID: URIC ACID: 6.9 mg/dL (ref 3.7–8.6)

## 2017-06-25 ENCOUNTER — Telehealth: Payer: Self-pay | Admitting: Family Medicine

## 2017-06-25 NOTE — Telephone Encounter (Signed)
Copied from CRM #[id number]. [phone]: Quick Communication - See Telephone Encounter >> Jun 25, 2017  4:18 PM Arlyss Gandyichardson, Shadi Larner N, NT wrote: CRM for notification. See Telephone encounter for: 06/25/17. Pt requesting a call to go over his lab results.

## 2017-06-25 NOTE — Telephone Encounter (Signed)
Provider, please review and release results.  

## 2017-07-12 ENCOUNTER — Other Ambulatory Visit: Payer: Self-pay

## 2017-07-12 ENCOUNTER — Ambulatory Visit (INDEPENDENT_AMBULATORY_CARE_PROVIDER_SITE_OTHER): Payer: PRIVATE HEALTH INSURANCE | Admitting: Family Medicine

## 2017-07-12 ENCOUNTER — Encounter: Payer: Self-pay | Admitting: Family Medicine

## 2017-07-12 VITALS — BP 110/78 | HR 76 | Temp 98.1°F | Ht 71.26 in | Wt 221.2 lb

## 2017-07-12 DIAGNOSIS — Z23 Encounter for immunization: Secondary | ICD-10-CM | POA: Diagnosis not present

## 2017-07-12 DIAGNOSIS — J309 Allergic rhinitis, unspecified: Secondary | ICD-10-CM | POA: Diagnosis not present

## 2017-07-12 DIAGNOSIS — M109 Gout, unspecified: Secondary | ICD-10-CM | POA: Diagnosis not present

## 2017-07-12 MED ORDER — MELOXICAM 7.5 MG PO TABS: 7.5000 mg | ORAL_TABLET | Freq: Every day | ORAL | 0 refills | Status: DC

## 2017-07-12 MED ORDER — FLUTICASONE PROPIONATE 50 MCG/ACT NA SUSP: 2.0000 | Freq: Every day | NASAL | 6 refills | Status: DC

## 2017-07-12 NOTE — Progress Notes (Signed)
[facility] [address] [place]. Flonase 50mcg and Mobic 7.5mg  cancelled.

## 2017-07-12 NOTE — Patient Instructions (Addendum)
Start flonase every day using technique we discussed.  Additionally can continue Allegra or Zyrtec over-the-counter for allergies.  See other information below.  Toe pain may still be some inflammation/soreness from initial gout flare.  Try meloxicam once per day.  Can take up to 2 pills/day, but use the lowest effective dose.  Hard soled shoe at work may be helpful to minimize bending of that joint.  If you are not improving in the next 10 to 14 days, return for recheck and likely x-ray.  Sooner if worse.  Tetanus vaccine updated today.  Allergic Rhinitis, Adult Allergic rhinitis is an allergic reaction that affects the mucous membrane inside the nose. It causes sneezing, a runny or stuffy nose, and the feeling of mucus going down the back of the throat (postnasal drip). Allergic rhinitis can be mild to severe. There are two types of allergic rhinitis:  Seasonal. This type is also called hay fever. It happens only during certain seasons.  Perennial. This type can happen at any time of the year.  What are the causes? This condition happens when the body's defense system (immune system) responds to certain harmless substances called allergens as though they were germs.  Seasonal allergic rhinitis is triggered by pollen, which can come from grasses, trees, and weeds. Perennial allergic rhinitis may be caused by:  House dust mites.  Pet dander.  Mold spores.  What are the signs or symptoms? Symptoms of this condition include:  Sneezing.  Runny or stuffy nose (nasal congestion).  Postnasal drip.  Itchy nose.  Tearing of the eyes.  Trouble sleeping.  Daytime sleepiness.  How is this diagnosed? This condition may be diagnosed based on:  Your medical history.  A physical exam.  Tests to check for related conditions, such as: ? Asthma. ? Pink eye. ? Ear infection. ? Upper respiratory infection.  Tests to find out which allergens trigger your symptoms. These may include  skin or blood tests.  How is this treated? There is no cure for this condition, but treatment can help control symptoms. Treatment may include:  Taking medicines that block allergy symptoms, such as antihistamines. Medicine may be given as a shot, nasal spray, or pill.  Avoiding the allergen.  Desensitization. This treatment involves getting ongoing shots until your body becomes less sensitive to the allergen. This treatment may be done if other treatments do not help.  If taking medicine and avoiding the allergen does not work, new, stronger medicines may be prescribed.  Follow these instructions at home:  Find out what you are allergic to. Common allergens include smoke, dust, and pollen.  Avoid the things you are allergic to. These are some things you can do to help avoid allergens: ? Replace carpet with wood, tile, or vinyl flooring. Carpet can trap dander and dust. ? Do not smoke. Do not allow smoking in your home. ? Change your heating and air conditioning filter at least once a month. ? During allergy season:  Keep windows closed as much as possible.  Plan outdoor activities when pollen counts are lowest. This is usually during the evening hours.  When coming indoors, change clothing and shower before sitting on furniture or bedding.  Take over-the-counter and prescription medicines only as told by your health care provider.  Keep all follow-up visits as told by your health care provider. This is important. Contact a health care provider if:  You have a fever.  You develop a persistent cough.  You make whistling sounds when you  breathe (you wheeze).  Your symptoms interfere with your normal daily activities. Get help right away if:  You have shortness of breath. Summary  This condition can be managed by taking medicines as directed and avoiding allergens.  Contact your health care provider if you develop a persistent cough or fever.  During allergy season,  keep windows closed as much as possible. This information is not intended to replace advice given to you by your health care provider. Make sure you discuss any questions you have with your health care provider. Document Released: 12/13/2000 Document Revised: 04/27/2016 Document Reviewed: 04/27/2016 Elsevier Interactive Patient Education  2018 [employer].   IF you received an x-ray today, you will receive an invoice from [facility]. Please contact [facility] at [phone] with questions or concerns regarding your invoice.   IF you received labwork today, you will receive an invoice from [place]. Please contact [facility] at [phone] with questions or concerns regarding your invoice.   Our billing staff will not be able to assist you with questions regarding bills from these companies.  You will be contacted with the lab results as soon as they are available. The fastest way to get your results is to activate your My Chart account. Instructions are located on the last page of this paperwork. If you have not heard from [place] regarding the results in 2 weeks, please contact this office.

## 2017-07-12 NOTE — Progress Notes (Signed)
Subjective:  By signing my name below, I, Andrew Foley, attest that this documentation has been prepared under the direction and in the presence of [name], MD. Electronically Signed: Stann Foley, Scribe. 07/12/2017 , 6:07 PM .  Patient was seen in Room 12 .   Patient ID: Andrew Foley, male    DOB: 1980-07-13, 36 y.o.   MRN: [id number] Chief Complaint  Patient presents with  . Gout    Possible gout on left big toe.(the toe keeps swelling up even after multiple icing session/icing foot everyday) Allergy season is making pt feel bad.   HPI Andrew BRANDEL is a 37 y.o. male Patient was evaluated for gout by Dr. Renaye Rakers, seen initially for pain and swelling of left great toe in March. He had some mild soft tissue swelling on exam and discomfort at MTP of left great toe. He had fluids drawn and received a steroid injection by orthopedist. His uric acid was slightly elevated at 6.9. Colchicine was prescribed if needed.   Patient states he hasn't been taking colchicine. He's noticed soreness and swelling in his left great toe after work. He informs swelling improves in the morning. He's been taking ibuprofen about once-twice every other day, but mainly applying ice over the area. Since his surgery on March 1st, he hasn't been wearing his steel toed shoes/work boots. His work schedule is 10:00AM-3:00PM Monday through Friday. He had xray done at ortho. He has a follow up appointment with surgeon on Monday, April 15th.   Seasonal allergies Patient states he's been taking OTC Zyrtec and Claritin for seasonal allergies. He hasn't noticed much improvement with either of the medications. He's used Flonase nasal spray in the past, but not recently.   Patient Active Problem List   Diagnosis Date Noted  . Rectal bleeding s/p EUA/ligation 06/01/2017 06/01/2017  . Postoperative hemorrhagic shock from OSH surgery 06/01/2017  . Perianal abscess 05/16/2017   Past Medical History:  Diagnosis  Date  . Abscess   . Hemorrhoids   . Rectal pain   . Rheumatic fever    as a child   Past Surgical History:  Procedure Laterality Date  . ADENOIDECTOMY    . ANAL EXAMINATION UNDER ANESTHESIA  06/01/2017   Dr Sheliah Hatch.  Brisk anal bleeding - cautery & suture control  . INCISION AND DRAINAGE ABSCESS ANAL  05/25/2017   [place].  Dr Albesa Seen.  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 PILONIDAL CYST EXCISION     Allergies  Allergen Reactions  . Codeine    Prior to Admission medications   Medication Sig Start Date End Date Taking? Authorizing Provider  colchicine 0.6 MG tablet 2 tabs at onset of symptoms, followed by 1 additional tablet in 1 hour if needed. 06/19/17   [name], MD  diazepam (VALIUM) 5 MG tablet Take 5 mg by mouth every 6 (six) hours as needed for anxiety. 05/24/17   [provider]  diclofenac sodium (VOLTAREN) 1 % GEL Apply 2 g topically 3 (three) times daily. 05/30/17   [provider]  ibuprofen (ADVIL,MOTRIN) 200 MG tablet Take 400 mg by mouth every 6 (six) hours as needed for headache, mild pain or moderate pain.     [provider]  lidocaine (XYLOCAINE) 5 % ointment Apply 1 application topically 3 (three) times daily as needed for pain. 05/30/17   [provider]  oxyCODONE-acetaminophen (PERCOCET/ROXICET) 5-325 MG tablet Take  1 tablet by mouth every 4 (four) hours as needed for pain. 05/24/17   [provider]   Social History   Socioeconomic History  . Marital status: Married    Spouse name: Andrew Foley  . Number of children: 3  . Years of education: Not on file  . Highest education level: High school graduate  Occupational History  . Not on file  Social Needs  . Financial resource strain: Somewhat hard  . Food insecurity:    Worry: Sometimes true    Inability: Never true  . Transportation needs:    Medical: No    Non-medical: No    Tobacco Use  . Smoking status: Light Tobacco Smoker    Types: Cigars  . Smokeless tobacco: Never Used  . Tobacco comment: 2 cigars a week  Substance and Sexual Activity  . Alcohol use: Yes    Alcohol/week: 12.0 oz    Types: 20 Cans of beer per week  . Drug use: No  . Sexual activity: Yes    Birth control/protection: None  Lifestyle  . Physical activity:    Days per week: 7 days    Minutes per session: 150+ min  . Stress: Not at all  Relationships  . Social connections:    Talks on phone: More than three times a week    Gets together: Twice a week    Attends religious service: More than 4 times per year    Active member of club or organization: Yes    Attends meetings of clubs or organizations: More than 4 times per year    Relationship status: Married  . Intimate partner violence:    Fear of current or ex partner: Patient refused    Emotionally abused: Patient refused    Physically abused: Patient refused    Forced sexual activity: Patient refused  Other Topics Concern  . Not on file  Social History Narrative  . Not on file   Review of Systems  Constitutional: Negative for fatigue and unexpected weight change.  HENT: Negative for sinus pressure.   Eyes: Negative for visual disturbance.  Respiratory: Negative for cough, chest tightness and shortness of breath.   Cardiovascular: Negative for chest pain, palpitations and leg swelling.  Gastrointestinal: Negative for abdominal pain and blood in stool.  Musculoskeletal: Positive for arthralgias (left great toe) and joint swelling (left great toe). Negative for gait problem.  Allergic/Immunologic: Positive for environmental allergies.  Neurological: Negative for dizziness, light-headedness and headaches.       Objective:   Physical Exam  Constitutional: He is oriented to person, place, and time. He appears well-developed and well-nourished.  HENT:  Head: Normocephalic and atraumatic.  Right Ear: Tympanic membrane,  external ear and ear canal normal.  Left Ear: Tympanic membrane, external ear and ear canal normal.  Nose: Mucosal edema (bilaterally, L>R) present. No rhinorrhea. Right sinus exhibits no maxillary sinus tenderness and no frontal sinus tenderness. Left sinus exhibits no maxillary sinus tenderness and no frontal sinus tenderness.  Mouth/Throat: Oropharynx is clear and moist and mucous membranes are normal. No oropharyngeal exudate or posterior oropharyngeal erythema.  Minimal infraorbital edema  Eyes: Pupils are equal, round, and reactive to light. Conjunctivae are normal.  Neck: Neck supple.  Cardiovascular: Normal rate, regular rhythm, normal heart sounds and intact distal pulses.  No murmur heard. Pulses:      Dorsalis pedis pulses are 2+ on the left side.  Pulmonary/Chest: Effort normal and breath sounds normal. He has no wheezes. He has no  rhonchi. He has no rales.  Abdominal: Soft. There is no tenderness.  Musculoskeletal:  Left foot: slight tenderness at first MTP, slight discomfort with palpation, skin intact, normal warmth, NVI distally  Lymphadenopathy:    He has no cervical adenopathy.  Neurological: He is alert and oriented to person, place, and time.  Skin: Skin is warm and dry. No rash noted.  Psychiatric: He has a normal mood and affect. His behavior is normal.  Vitals reviewed.   Vitals:   07/12/17 1717  BP: 110/78  Pulse: 76  Temp: 98.1 F (36.7 C)  TempSrc: Oral  SpO2: 99%  Weight: 221 lb 3.2 oz (100.3 kg)  Height: 5' 11.26" (1.81 m)       Assessment & Plan:   Hubbard RobinsonClifton E Mendell is a 37 y.o. male Gouty arthritis of left great toe - Plan: meloxicam (MOBIC) 7.5 MG tablet, DISCONTINUED: meloxicam (MOBIC) 7.5 MG tablet  -May be some residual inflammation, discomfort from prior layer.  Will try meloxicam daily for the next 1-2 weeks, then if not improving consider imaging.  Need for diphtheria-tetanus-pertussis (Tdap) vaccine - Plan: Tdap vaccine greater than or  equal to 7yo IM given  Allergic rhinitis, unspecified seasonality, unspecified trigger - Plan: fluticasone (FLONASE) 50 MCG/ACT nasal spray, DISCONTINUED: fluticasone (FLONASE) 50 MCG/ACT nasal spray  -Steroid nasal spray discussed in addition to antihistamine.  Correct technique for using steroid nasal spray was discussed, trigger avoidance for allergens discussed, handout given.  RTC precautions if worsening.  Meds ordered this encounter  Medications  . DISCONTD: fluticasone (FLONASE) 50 MCG/ACT nasal spray    Sig: Place 2 sprays into both nostrils daily.    Dispense:  16 g    Refill:  6  . DISCONTD: meloxicam (MOBIC) 7.5 MG tablet    Sig: Take 1-2 tablets (7.5-15 mg total) by mouth daily.    Dispense:  30 tablet    Refill:  0  . fluticasone (FLONASE) 50 MCG/ACT nasal spray    Sig: Place 2 sprays into both nostrils daily.    Dispense:  16 g    Refill:  6  . meloxicam (MOBIC) 7.5 MG tablet    Sig: Take 1-2 tablets (7.5-15 mg total) by mouth daily.    Dispense:  30 tablet    Refill:  0   Patient Instructions   Start flonase every day using technique we discussed.  Additionally can continue Allegra or Zyrtec over-the-counter for allergies.  See other information below.  Toe pain may still be some inflammation/soreness from initial gout flare.  Try meloxicam once per day.  Can take up to 2 pills/day, but use the lowest effective dose.  Hard soled shoe at work may be helpful to minimize bending of that joint.  If you are not improving in the next 10 to 14 days, return for recheck and likely x-ray.  Sooner if worse.  Tetanus vaccine updated today.  Allergic Rhinitis, Adult Allergic rhinitis is an allergic reaction that affects the mucous membrane inside the nose. It causes sneezing, a runny or stuffy nose, and the feeling of mucus going down the back of the throat (postnasal drip). Allergic rhinitis can be mild to severe. There are two types of allergic rhinitis:  Seasonal. This type  is also called hay fever. It happens only during certain seasons.  Perennial. This type can happen at any time of the year.  What are the causes? This condition happens when the body's defense system (immune system) responds to certain harmless substances called  allergens as though they were germs.  Seasonal allergic rhinitis is triggered by pollen, which can come from grasses, trees, and weeds. Perennial allergic rhinitis may be caused by:  House dust mites.  Pet dander.  Mold spores.  What are the signs or symptoms? Symptoms of this condition include:  Sneezing.  Runny or stuffy nose (nasal congestion).  Postnasal drip.  Itchy nose.  Tearing of the eyes.  Trouble sleeping.  Daytime sleepiness.  How is this diagnosed? This condition may be diagnosed based on:  Your medical history.  A physical exam.  Tests to check for related conditions, such as: ? Asthma. ? Pink eye. ? Ear infection. ? Upper respiratory infection.  Tests to find out which allergens trigger your symptoms. These may include skin or blood tests.  How is this treated? There is no cure for this condition, but treatment can help control symptoms. Treatment may include:  Taking medicines that block allergy symptoms, such as antihistamines. Medicine may be given as a shot, nasal spray, or pill.  Avoiding the allergen.  Desensitization. This treatment involves getting ongoing shots until your body becomes less sensitive to the allergen. This treatment may be done if other treatments do not help.  If taking medicine and avoiding the allergen does not work, new, stronger medicines may be prescribed.  Follow these instructions at home:  Find out what you are allergic to. Common allergens include smoke, dust, and pollen.  Avoid the things you are allergic to. These are some things you can do to help avoid allergens: ? Replace carpet with wood, tile, or vinyl flooring. Carpet can trap dander and  dust. ? Do not smoke. Do not allow smoking in your home. ? Change your heating and air conditioning filter at least once a month. ? During allergy season:  Keep windows closed as much as possible.  Plan outdoor activities when pollen counts are lowest. This is usually during the evening hours.  When coming indoors, change clothing and shower before sitting on furniture or bedding.  Take over-the-counter and prescription medicines only as told by your health care provider.  Keep all follow-up visits as told by your health care provider. This is important. Contact a health care provider if:  You have a fever.  You develop a persistent cough.  You make whistling sounds when you breathe (you wheeze).  Your symptoms interfere with your normal daily activities. Get help right away if:  You have shortness of breath. Summary  This condition can be managed by taking medicines as directed and avoiding allergens.  Contact your health care provider if you develop a persistent cough or fever.  During allergy season, keep windows closed as much as possible. This information is not intended to replace advice given to you by your health care provider. Make sure you discuss any questions you have with your health care provider. Document Released: 12/13/2000 Document Revised: 04/27/2016 Document Reviewed: 04/27/2016 Elsevier Interactive Patient Education  2018 [employer].   IF you received an x-ray today, you will receive an invoice from [facility]. Please contact [facility] at [phone] with questions or concerns regarding your invoice.   IF you received labwork today, you will receive an invoice from [facility] [place]. Please contact [facility] at [phone] with questions or concerns regarding your invoice.   Our billing staff will not be able to assist you with questions regarding bills from these companies.  You will be contacted with the lab results as soon as they  are available.  The fastest way to get your results is to activate your My Chart account. Instructions are located on the last page of this paperwork. If you have not heard from [place] regarding the results in 2 weeks, please contact this office.       I personally performed the services described in this documentation, which was scribed in my presence. The recorded information has been reviewed and considered for accuracy and completeness, addended by me as needed, and agree with information above.  Signed,   [name], MD [facility].  07/14/17 9:52 AM

## 2017-07-14 ENCOUNTER — Encounter: Payer: Self-pay | Admitting: Family Medicine

## 2017-08-02 ENCOUNTER — Ambulatory Visit (INDEPENDENT_AMBULATORY_CARE_PROVIDER_SITE_OTHER): Payer: PRIVATE HEALTH INSURANCE | Admitting: Urgent Care

## 2017-08-02 ENCOUNTER — Encounter: Payer: Self-pay | Admitting: Urgent Care

## 2017-08-02 VITALS — BP 114/80 | HR 69 | Temp 98.1°F | Resp 16 | Ht 71.26 in | Wt 218.8 lb

## 2017-08-02 DIAGNOSIS — R059 Cough, unspecified: Secondary | ICD-10-CM

## 2017-08-02 DIAGNOSIS — J3089 Other allergic rhinitis: Secondary | ICD-10-CM

## 2017-08-02 DIAGNOSIS — J0100 Acute maxillary sinusitis, unspecified: Secondary | ICD-10-CM

## 2017-08-02 DIAGNOSIS — R05 Cough: Secondary | ICD-10-CM

## 2017-08-02 MED ORDER — PREDNISONE 20 MG PO TABS: ORAL_TABLET | ORAL | 0 refills | Status: DC

## 2017-08-02 MED ORDER — LORATADINE 10 MG PO TABS
10.0000 mg | ORAL_TABLET | Freq: Every day | ORAL | 11 refills | Status: DC
Start: 2017-08-02 — End: 2017-10-01

## 2017-08-02 MED ORDER — CETIRIZINE HCL 10 MG PO TABS: 10.0000 mg | ORAL_TABLET | Freq: Every day | ORAL | 11 refills | Status: DC

## 2017-08-02 MED ORDER — AMOXICILLIN 500 MG PO CAPS: 500.0000 mg | ORAL_CAPSULE | Freq: Three times a day (TID) | ORAL | 0 refills | Status: DC

## 2017-08-02 NOTE — Patient Instructions (Addendum)
Sinusitis, Adult Sinusitis is soreness and inflammation of your sinuses. Sinuses are hollow spaces in the bones around your face. Your sinuses are located:  Around your eyes.  In the middle of your forehead.  Behind your nose.  In your cheekbones.  Your sinuses and nasal passages are lined with a stringy fluid (mucus). Mucus normally drains out of your sinuses. When your nasal tissues become inflamed or swollen, the mucus can become trapped or blocked so air cannot flow through your sinuses. This allows bacteria, viruses, and funguses to grow, which leads to infection. Sinusitis can develop quickly and last for 7?10 days (acute) or for more than 12 weeks (chronic). Sinusitis often develops after a cold. What are the causes? This condition is caused by anything that creates swelling in the sinuses or stops mucus from draining, including:  Allergies.  Asthma.  Bacterial or viral infection.  Abnormally shaped bones between the nasal passages.  Nasal growths that contain mucus (nasal polyps).  Narrow sinus openings.  Pollutants, such as chemicals or irritants in the air.  A foreign object stuck in the nose.  A fungal infection. This is rare.  What increases the risk? The following factors may make you more likely to develop this condition:  Having allergies or asthma.  Having had a recent cold or respiratory tract infection.  Having structural deformities or blockages in your nose or sinuses.  Having a weak immune system.  Doing a lot of swimming or diving.  Overusing nasal sprays.  Smoking.  What are the signs or symptoms? The main symptoms of this condition are pain and a feeling of pressure around the affected sinuses. Other symptoms include:  Upper toothache.  Earache.  Headache.  Bad breath.  Decreased sense of smell and taste.  A cough that may get worse at night.  Fatigue.  Fever.  Thick drainage from your nose. The drainage is often green and  it may contain pus (purulent).  Stuffy nose or congestion.  Postnasal drip. This is when extra mucus collects in the throat or back of the nose.  Swelling and warmth over the affected sinuses.  Sore throat.  Sensitivity to light.  How is this diagnosed? This condition is diagnosed based on symptoms, a medical history, and a physical exam. To find out if your condition is acute or chronic, your health care provider may:  Look in your nose for signs of nasal polyps.  Tap over the affected sinus to check for signs of infection.  View the inside of your sinuses using an imaging device that has a light attached (endoscope).  If your health care provider suspects that you have chronic sinusitis, you may also:  Be tested for allergies.  Have a sample of mucus taken from your nose (nasal culture) and checked for bacteria.  Have a mucus sample examined to see if your sinusitis is related to an allergy.  If your sinusitis does not respond to treatment and it lasts longer than 8 weeks, you may have an MRI or CT scan to check your sinuses. These scans also help to determine how severe your infection is. In rare cases, a bone biopsy may be done to rule out more serious types of fungal sinus disease. How is this treated? Treatment for sinusitis depends on the cause and whether your condition is chronic or acute. If a virus is causing your sinusitis, your symptoms will go away on their own within 10 days. You may be given medicines to relieve your symptoms,   including:  Topical nasal decongestants. They shrink swollen nasal passages and let mucus drain from your sinuses.  Antihistamines. These drugs block inflammation that is triggered by allergies. This can help to ease swelling in your nose and sinuses.  Topical nasal corticosteroids. These are nasal sprays that ease inflammation and swelling in your nose and sinuses.  Nasal saline washes. These rinses can help to get rid of thick mucus in  your nose.  If your condition is caused by bacteria, you will be given an antibiotic medicine. If your condition is caused by a fungus, you will be given an antifungal medicine. Surgery may be needed to correct underlying conditions, such as narrow nasal passages. Surgery may also be needed to remove polyps. Follow these instructions at home: Medicines  Take, use, or apply over-the-counter and prescription medicines only as told by your health care provider. These may include nasal sprays.  If you were prescribed an antibiotic medicine, take it as told by your health care provider. Do not stop taking the antibiotic even if you start to feel better. Hydrate and Humidify  Drink enough water to keep your urine clear or pale yellow. Staying hydrated will help to thin your mucus.  Use a cool mist humidifier to keep the humidity level in your home above 50%.  Inhale steam for 10-15 minutes, 3-4 times a day or as told by your health care provider. You can do this in the bathroom while a hot shower is running.  Limit your exposure to cool or dry air. Rest  Rest as much as possible.  Sleep with your head raised (elevated).  Make sure to get enough sleep each night. General instructions  Apply a warm, moist washcloth to your face 3-4 times a day or as told by your health care provider. This will help with discomfort.  Wash your hands often with soap and water to reduce your exposure to viruses and other germs. If soap and water are not available, use hand sanitizer.  Do not smoke. Avoid being around people who are smoking (secondhand smoke).  Keep all follow-up visits as told by your health care provider. This is important. Contact a health care provider if:  You have a fever.  Your symptoms get worse.  Your symptoms do not improve within 10 days. Get help right away if:  You have a severe headache.  You have persistent vomiting.  You have pain or swelling around your face or  eyes.  You have vision problems.  You develop confusion.  Your neck is stiff.  You have trouble breathing. This information is not intended to replace advice given to you by your health care provider. Make sure you discuss any questions you have with your health care provider. Document Released: 03/20/2005 Document Revised: 11/14/2015 Document Reviewed: 01/13/2015 Elsevier Interactive Patient Education  2018 [employer]     IF you received an x-ray today, you will receive an invoice from [facility]. Please contact [facility] at [phone] with questions or concerns regarding your invoice.   IF you received labwork today, you will receive an invoice from [facility]. Please contact [facility] at [phone] with questions or concerns regarding your invoice.   Our billing staff will not be able to assist you with questions regarding bills from these companies.  You will be contacted with the lab results as soon as they are available. The fastest way to get your results is to activate your My Chart account. Instructions are located on the last page   of this paperwork. If you have not heard from us regarding the results in 2 weeks, please contact this office.      

## 2017-08-02 NOTE — Progress Notes (Signed)
    MRN: [id number] DOB: 10/25/1980  Subjective:   Andrew Foley is a 37 y.o. male presenting for more than 1 week history of persistent sinus pain, sinus pressure, sinus drainage, bilateral ear fullness, productive cough, malaise.  Patient has been using Mucinex consistently.  He has a history of difficult to control allergies but uses Flonase consistently.  Denies ear drainage, chest pain, shortness of breath, wheezing, nausea, vomiting, belly pain, rashes.  Smokes a couple cigars per week.  [place] has a current medication list which includes the following prescription(s): fluticasone and meloxicam. Also is allergic to codeine.  Andrew Foley  has a past medical history of Abscess, Hemorrhoids, Rectal pain, and Rheumatic fever. Also  has a past surgical history that includes Pilonidal cyst excision; Adenoidectomy; Incision and drainage abscess anal (05/25/2017); and Anal examination under anesthesia (06/01/2017).  Objective:   Vitals: BP 114/80   Pulse 69   Temp 98.1 F (36.7 C) (Oral)   Resp 16   Ht 5' 11.26" (1.81 m)   Wt 218 lb 12.8 oz (99.2 kg)   SpO2 99%   BMI 30.29 kg/m   Physical Exam  Constitutional: He is oriented to person, place, and time. He appears well-developed and well-nourished.  HENT:  Right Ear: Tympanic membrane normal.  Left Ear: Tympanic membrane normal.  Nose: Mucosal edema, rhinorrhea and sinus tenderness present.  Mouth/Throat: Oropharynx is clear and moist.  Eyes: Right eye exhibits no discharge. Left eye exhibits no discharge.  Cardiovascular: Normal rate, regular rhythm and intact distal pulses. Exam reveals no gallop and no friction rub.  No murmur heard. Pulmonary/Chest: No respiratory distress. He has no wheezes. He has no rales.  Neurological: He is alert and oriented to person, place, and time.  Psychiatric: He has a normal mood and affect.    Assessment and Plan :   Acute non-recurrent maxillary sinusitis  Non-seasonal allergic rhinitis due  to other allergic trigger  Cough  Will start patient on amoxicillin to address sinusitis, prednisone to address his cough given his allergies to codeine.  Will have patient start Zyrtec with Claritin and hold off on Flonase until he has resolution of his sinusitis. Return-to-clinic precautions discussed, patient verbalized understanding.   Wallis Bamberg, PA-C Primary Care at [facility] [phone] 08/02/2017  3:59 PM

## 2017-08-06 ENCOUNTER — Encounter: Payer: Self-pay | Admitting: Urgent Care

## 2017-09-28 ENCOUNTER — Telehealth: Payer: Self-pay | Admitting: Urgent Care

## 2017-09-28 NOTE — Telephone Encounter (Signed)
Pt following up on the request for a work note.  Pt advised since he has not been seen since 05/2017 for gout, and Dr Neva SeatGreene is not in the office, he will need to be seen for a work note. Pt verbalized understanding, and scheduled an appt for Monday, 7/01 with Dr Neva SeatGreene.

## 2017-09-28 NOTE — Telephone Encounter (Signed)
Pt calling back stating that HR has called him and advised him that they haven't received note for being out due to flair up  HR fax number Attn Gerarda FractionMelissa Anderson [phone]

## 2017-09-28 NOTE — Telephone Encounter (Signed)
Copied from CRM [id number]. 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

## 2017-10-01 ENCOUNTER — Encounter: Payer: Self-pay | Admitting: Family Medicine

## 2017-10-01 ENCOUNTER — Other Ambulatory Visit: Payer: Self-pay

## 2017-10-01 ENCOUNTER — Ambulatory Visit (INDEPENDENT_AMBULATORY_CARE_PROVIDER_SITE_OTHER): Payer: PRIVATE HEALTH INSURANCE

## 2017-10-01 ENCOUNTER — Ambulatory Visit (INDEPENDENT_AMBULATORY_CARE_PROVIDER_SITE_OTHER): Payer: PRIVATE HEALTH INSURANCE | Admitting: Family Medicine

## 2017-10-01 VITALS — BP 127/79 | HR 57 | Temp 97.7°F | Ht 71.0 in | Wt 209.6 lb

## 2017-10-01 DIAGNOSIS — M79675 Pain in left toe(s): Secondary | ICD-10-CM

## 2017-10-01 DIAGNOSIS — M109 Gout, unspecified: Secondary | ICD-10-CM

## 2017-10-01 MED ORDER — PREDNISONE 20 MG PO TABS: 40.0000 mg | ORAL_TABLET | Freq: Every day | ORAL | 0 refills | Status: DC

## 2017-10-01 MED ORDER — COLCHICINE 0.6 MG PO TABS: 0.6000 mg | ORAL_TABLET | Freq: Every day | ORAL | 0 refills | Status: DC | PRN

## 2017-10-01 NOTE — Progress Notes (Signed)
Subjective:    Patient ID: AMIIR HECKARD, male    DOB: 03-Jun-1980, 37 y.o.   MRN: [id number]  HPI TAYSEAN WAGER is a 37 y.o. male Presents today for: Chief Complaint  Patient presents with  . Gout flair    left big toe sore and a bit swollen   . request a letter    for the gout for his job    Here for gout flare.  He was seen in March and April gouty arthritis of left great toe.  Initially had been seen by orthopedics, Dr. Renaye Rakers.  Fluid was withdrawn from his MTP joint of the left great toe, steroid injection was given.  Uric acid was slightly elevated at 6.9.  Colchicine was provided if needed.  He did notice soreness/swelling in his left great toe after work when discussed April 11.  It was thought that he may still be having some inflammation from the initial gout flare.  He was treated with meloxicam 7.5 mg 1 to 2/day, hard soled shoe at work may also have been beneficial to minimize bending of his MTP joint.  RTC precautions if not continuing to improve. Still has some leftover mobic. Toe had been improving until recent flare.  FH of gout in uncles and mom. Was treated by ortho for turf toe a few years ago.   L great toe started after walking at work on ladder 1 week ago. no fall or injury. Has been working 12 hour days past few weeks. Throbbing pain and swelling 2 days later. Slight warmth. No initial meds, but started colchicine 5 days ago - total of 5 pills in past 5 days, but none yesterday. Less sore but still some soreness and swelling. Needs a note for work - missed last Friday. Needs note that may occur again.   Patient Active Problem List   Diagnosis Date Noted  . Rectal bleeding s/p EUA/ligation 06/01/2017 06/01/2017  . Postoperative hemorrhagic shock from OSH surgery 06/01/2017  . Perianal abscess 05/16/2017   Past Medical History:  Diagnosis Date  . Abscess   . Hemorrhoids   . Rectal pain   . Rheumatic fever    as a child   Past Surgical History:    Procedure Laterality Date  . ADENOIDECTOMY    . ANAL EXAMINATION UNDER ANESTHESIA  06/01/2017   Dr Sheliah Hatch.  Brisk anal bleeding - cautery & suture control  . INCISION AND DRAINAGE ABSCESS ANAL  05/25/2017   [place].  Dr Albesa Seen.  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 PILONIDAL CYST EXCISION     Allergies  Allergen Reactions  . Codeine    Prior to Admission medications   Medication Sig Start Date End Date Taking? Authorizing Provider  colchicine 0.6 MG tablet Take 0.6 mg by mouth daily.   Yes [provider]  fluticasone (FLONASE) 50 MCG/ACT nasal spray Place 2 sprays into both nostrils daily. 07/12/17  Yes [name], MD   Social History   Socioeconomic History  . Marital status: Married    Spouse name: [name]  . Number of children: 3  . Years of education: Not on file  . Highest education level: High school graduate  Occupational History  . Not on file  Social Needs  . Financial resource strain: Somewhat hard  . Food insecurity:    Worry: Sometimes true    Inability: Never true  . Transportation needs:  Medical: No    Non-medical: No  Tobacco Use  . Smoking status: Light Tobacco Smoker    Types: Cigars  . Smokeless tobacco: Never Used  . Tobacco comment: 2 cigars a week  Substance and Sexual Activity  . Alcohol use: Yes    Alcohol/week: 12.0 oz    Types: 20 Cans of beer per week  . Drug use: No  . Sexual activity: Yes    Birth control/protection: None  Lifestyle  . Physical activity:    Days per week: 7 days    Minutes per session: 150+ min  . Stress: Not at all  Relationships  . Social connections:    Talks on phone: More than three times a week    Gets together: Twice a week    Attends religious service: More than 4 times per year    Active member of club or organization: Yes    Attends meetings of clubs or organizations: More than 4 times per year     Relationship status: Married  . Intimate partner violence:    Fear of current or ex partner: Patient refused    Emotionally abused: Patient refused    Physically abused: Patient refused    Forced sexual activity: Patient refused  Other Topics Concern  . Not on file  Social History Narrative  . Not on file    Review of Systems  Constitutional: Negative for chills and fever.  Musculoskeletal: Positive for arthralgias and joint swelling.  Skin: Negative for color change and wound.       Objective:   Physical Exam  Constitutional: He is oriented to person, place, and time. He appears well-developed and well-nourished. No distress.  HENT:  Head: Normocephalic and atraumatic.  Cardiovascular: Normal rate.  Pulmonary/Chest: Effort normal.  Musculoskeletal:       Left foot: There is decreased range of motion, tenderness and bony tenderness.       Feet:  Neurological: He is alert and oriented to person, place, and time.  Psychiatric: He has a normal mood and affect.   Vitals:   10/01/17 1042  BP: 127/79  Pulse: (!) 57  Temp: 97.7 F (36.5 C)  TempSrc: Oral  SpO2: 100%  Weight: 209 lb 9.6 oz (95.1 kg)  Height: 5\' 11"  (1.803 m)   Dg Toe Great Left  Result Date: 10/01/2017 CLINICAL DATA:  Left great toe swelling, pain EXAM: LEFT GREAT TOE COMPARISON:  None. FINDINGS: There is no evidence of fracture or dislocation. There is no evidence of arthropathy or other focal bone abnormality. Soft tissues are unremarkable. IMPRESSION: Negative. Electronically Signed   By: Charlett NoseKevin  Dover M.D.   On: 10/01/2017 11:51        Assessment & Plan:    Hubbard RobinsonClifton E Pascarella is a 37 y.o. male Pain in left toe(s) - Plan: DG Toe Great Left, predniSONE (DELTASONE) 20 MG tablet, colchicine 0.6 MG tablet  Gout, arthritis - Plan: DG Toe Great Left, predniSONE (DELTASONE) 20 MG tablet, colchicine 0.6 MG tablet  Suspected gout flare with persistent symptoms. Reassuring XR.   - continue colchicine, then if  not continuing to improve can fill prescription for prednisone.  Would consider daily medication if flare within the next few months.  RTC precautions given and note for work.  Meds ordered this encounter  Medications  . predniSONE (DELTASONE) 20 MG tablet    Sig: Take 2 tablets (40 mg total) by mouth daily with breakfast.    Dispense:  10 tablet  Refill:  0  . colchicine 0.6 MG tablet    Sig: Take 1 tablet (0.6 mg total) by mouth daily as needed (with gout flare.).    Dispense:  30 tablet    Refill:  0   Patient Instructions    Okay to continue colchicine once per day for now as long as your toe pain is improving.  If that is not continuing to improve this week, I did print out prednisone.  If you do take prednisone, do not take NSAIDs over-the-counter or colchicine at that point.  If you do have a recurrence of the gout flare within the next 1 to 2 months, I would consider starting a daily medication. Return to the clinic or go to the nearest emergency room if any of your symptoms worsen or new symptoms occur.    Gout Gout is painful swelling that can occur in some of your joints. Gout is a type of arthritis. This condition is caused by having too much uric acid in your body. Uric acid is a chemical that forms when your body breaks down substances called purines. Purines are important for building body proteins. When your body has too much uric acid, sharp crystals can form and build up inside your joints. This causes pain and swelling. Gout attacks can happen quickly and be very painful (acute gout). Over time, the attacks can affect more joints and become more frequent (chronic gout). Gout can also cause uric acid to build up under your skin and inside your kidneys. What are the causes? This condition is caused by too much uric acid in your blood. This can occur because:  Your kidneys do not remove enough uric acid from your blood. This is the most common cause.  Your body makes  too much uric acid. This can occur with some cancers and cancer treatments. It can also occur if your body is breaking down too many red blood cells (hemolytic anemia).  You eat too many foods that are high in purines. These foods include organ meats and some seafood. Alcohol, especially beer, is also high in purines.  A gout attack may be triggered by trauma or stress. What increases the risk? This condition is more likely to develop in people who:  Have a family history of gout.  Are male and middle-aged.  Are male and have gone through menopause.  Are obese.  Frequently drink alcohol, especially beer.  Are dehydrated.  Lose weight too quickly.  Have an organ transplant.  Have lead poisoning.  Take certain medicines, including aspirin, cyclosporine, diuretics, levodopa, and niacin.  Have kidney disease or psoriasis.  What are the signs or symptoms? An attack of acute gout happens quickly. It usually occurs in just one joint. The most common place is the big toe. Attacks often start at night. Other joints that may be affected include joints of the feet, ankle, knee, fingers, wrist, or elbow. Symptoms may include:  Severe pain.  Warmth.  Swelling.  Stiffness.  Tenderness. The affected joint may be very painful to touch.  Shiny, red, or purple skin.  Chills and fever.  Chronic gout may cause symptoms more frequently. More joints may be involved. You may also have white or yellow lumps (tophi) on your hands or feet or in other areas near your joints. How is this diagnosed? This condition is diagnosed based on your symptoms, medical history, and physical exam. You may have tests, such as:  Blood tests to measure uric acid levels.  Removal of joint fluid with a needle (aspiration) to look for uric acid crystals.  X-rays to look for joint damage.  How is this treated? Treatment for this condition has two phases: treating an acute attack and preventing future  attacks. Acute gout treatment may include medicines to reduce pain and swelling, including:  NSAIDs.  Steroids. These are strong anti-inflammatory medicines that can be taken by mouth (orally) or injected into a joint.  Colchicine. This medicine relieves pain and swelling when it is taken soon after an attack. It can be given orally or through an IV tube.  Preventive treatment may include:  Daily use of smaller doses of NSAIDs or colchicine.  Use of a medicine that reduces uric acid levels in your blood.  Changes to your diet. You may need to see a specialist about healthy eating (dietitian).  Follow these instructions at home: During a Gout Attack  If directed, apply ice to the affected area: ? Put ice in a plastic bag. ? Place a towel between your skin and the bag. ? Leave the ice on for 20 minutes, 2-3 times a day.  Rest the joint as much as possible. If the affected joint is in your leg, you may be given crutches to use.  Raise (elevate) the affected joint above the level of your heart as often as possible.  Drink enough fluids to keep your urine clear or pale yellow.  Take over-the-counter and prescription medicines only as told by your health care provider.  Do not drive or operate heavy machinery while taking prescription pain medicine.  Follow instructions from your health care provider about eating or drinking restrictions.  Return to your normal activities as told by your health care provider. Ask your health care provider what activities are safe for you. Avoiding Future Gout Attacks  Follow a low-purine diet as told by your dietitian or health care provider. Avoid foods and drinks that are high in purines, including liver, kidney, anchovies, asparagus, herring, mushrooms, mussels, and beer.  Limit alcohol intake to no more than 1 drink a day for nonpregnant women and 2 drinks a day for men. One drink equals 12 oz of beer, 5 oz of wine, or 1 oz of hard  liquor.  Maintain a healthy weight or lose weight if you are overweight. If you want to lose weight, talk with your health care provider. It is important that you do not lose weight too quickly.  Start or maintain an exercise program as told by your health care provider.  Drink enough fluids to keep your urine clear or pale yellow.  Take over-the-counter and prescription medicines only as told by your health care provider.  Keep all follow-up visits as told by your health care provider. This is important. Contact a health care provider if:  You have another gout attack.  You continue to have symptoms of a gout attack after10 days of treatment.  You have side effects from your medicines.  You have chills or a fever.  You have burning pain when you urinate.  You have pain in your lower back or belly. Get help right away if:  You have severe or uncontrolled pain.  You cannot urinate. This information is not intended to replace advice given to you by your health care provider. Make sure you discuss any questions you have with your health care provider. Document Released: 03/17/2000 Document Revised: 08/26/2015 Document Reviewed: 12/31/2014 Elsevier Interactive Patient Education  2018 [employer].   IF you  received an x-ray today, you will receive an invoice from [facility]. Please contact [facility] at [phone] with questions or concerns regarding your invoice.   IF you received labwork today, you will receive an invoice from [facility]. Please contact [facility] at [phone] with questions or concerns regarding your invoice.   Our billing staff will not be able to assist you with questions regarding bills from these companies.  You will be contacted with the lab results as soon as they are available. The fastest way to get your results is to activate your My Chart account. Instructions are located on the last page of this paperwork. If you have not heard  from [place] regarding the results in 2 weeks, please contact this office.       Signed,   [name], MD Primary Care at [facility].  10/04/17 9:34 AM

## 2017-10-01 NOTE — Patient Instructions (Addendum)
Okay to continue colchicine once per day for now as long as your toe pain is improving.  If that is not continuing to improve this week, I did print out prednisone.  If you do take prednisone, do not take NSAIDs over-the-counter or colchicine at that point.  If you do have a recurrence of the gout flare within the next 1 to 2 months, I would consider starting a daily medication. Return to the clinic or go to the nearest emergency room if any of your symptoms worsen or new symptoms occur.    Gout Gout is painful swelling that can occur in some of your joints. Gout is a type of arthritis. This condition is caused by having too much uric acid in your body. Uric acid is a chemical that forms when your body breaks down substances called purines. Purines are important for building body proteins. When your body has too much uric acid, sharp crystals can form and build up inside your joints. This causes pain and swelling. Gout attacks can happen quickly and be very painful (acute gout). Over time, the attacks can affect more joints and become more frequent (chronic gout). Gout can also cause uric acid to build up under your skin and inside your kidneys. What are the causes? This condition is caused by too much uric acid in your blood. This can occur because:  Your kidneys do not remove enough uric acid from your blood. This is the most common cause.  Your body makes too much uric acid. This can occur with some cancers and cancer treatments. It can also occur if your body is breaking down too many red blood cells (hemolytic anemia).  You eat too many foods that are high in purines. These foods include organ meats and some seafood. Alcohol, especially beer, is also high in purines.  A gout attack may be triggered by trauma or stress. What increases the risk? This condition is more likely to develop in people who:  Have a family history of gout.  Are male and middle-aged.  Are male and have gone  through menopause.  Are obese.  Frequently drink alcohol, especially beer.  Are dehydrated.  Lose weight too quickly.  Have an organ transplant.  Have lead poisoning.  Take certain medicines, including aspirin, cyclosporine, diuretics, levodopa, and niacin.  Have kidney disease or psoriasis.  What are the signs or symptoms? An attack of acute gout happens quickly. It usually occurs in just one joint. The most common place is the big toe. Attacks often start at night. Other joints that may be affected include joints of the feet, ankle, knee, fingers, wrist, or elbow. Symptoms may include:  Severe pain.  Warmth.  Swelling.  Stiffness.  Tenderness. The affected joint may be very painful to touch.  Shiny, red, or purple skin.  Chills and fever.  Chronic gout may cause symptoms more frequently. More joints may be involved. You may also have white or yellow lumps (tophi) on your hands or feet or in other areas near your joints. How is this diagnosed? This condition is diagnosed based on your symptoms, medical history, and physical exam. You may have tests, such as:  Blood tests to measure uric acid levels.  Removal of joint fluid with a needle (aspiration) to look for uric acid crystals.  X-rays to look for joint damage.  How is this treated? Treatment for this condition has two phases: treating an acute attack and preventing future attacks. Acute gout treatment may include medicines  to reduce pain and swelling, including:  NSAIDs.  Steroids. These are strong anti-inflammatory medicines that can be taken by mouth (orally) or injected into a joint.  Colchicine. This medicine relieves pain and swelling when it is taken soon after an attack. It can be given orally or through an IV tube.  Preventive treatment may include:  Daily use of smaller doses of NSAIDs or colchicine.  Use of a medicine that reduces uric acid levels in your blood.  Changes to your diet. You may  need to see a specialist about healthy eating (dietitian).  Follow these instructions at home: During a Gout Attack  If directed, apply ice to the affected area: ? Put ice in a plastic bag. ? Place a towel between your skin and the bag. ? Leave the ice on for 20 minutes, 2-3 times a day.  Rest the joint as much as possible. If the affected joint is in your leg, you may be given crutches to use.  Raise (elevate) the affected joint above the level of your heart as often as possible.  Drink enough fluids to keep your urine clear or pale yellow.  Take over-the-counter and prescription medicines only as told by your health care provider.  Do not drive or operate heavy machinery while taking prescription pain medicine.  Follow instructions from your health care provider about eating or drinking restrictions.  Return to your normal activities as told by your health care provider. Ask your health care provider what activities are safe for you. Avoiding Future Gout Attacks  Follow a low-purine diet as told by your dietitian or health care provider. Avoid foods and drinks that are high in purines, including liver, kidney, anchovies, asparagus, herring, mushrooms, mussels, and beer.  Limit alcohol intake to no more than 1 drink a day for nonpregnant women and 2 drinks a day for men. One drink equals 12 oz of beer, 5 oz of wine, or 1 oz of hard liquor.  Maintain a healthy weight or lose weight if you are overweight. If you want to lose weight, talk with your health care provider. It is important that you do not lose weight too quickly.  Start or maintain an exercise program as told by your health care provider.  Drink enough fluids to keep your urine clear or pale yellow.  Take over-the-counter and prescription medicines only as told by your health care provider.  Keep all follow-up visits as told by your health care provider. This is important. Contact a health care provider if:  You  have another gout attack.  You continue to have symptoms of a gout attack after10 days of treatment.  You have side effects from your medicines.  You have chills or a fever.  You have burning pain when you urinate.  You have pain in your lower back or belly. Get help right away if:  You have severe or uncontrolled pain.  You cannot urinate. This information is not intended to replace advice given to you by your health care provider. Make sure you discuss any questions you have with your health care provider. Document Released: 03/17/2000 Document Revised: 08/26/2015 Document Reviewed: 12/31/2014 Elsevier Interactive Patient Education  2018 [employer].   IF you received an x-ray today, you will receive an invoice from [facility]. Please contact [facility] at [phone] with questions or concerns regarding your invoice.   IF you received labwork today, you will receive an invoice from [facility]. Please contact [facility] at [phone] with questions or  concerns regarding your invoice.   Our billing staff will not be able to assist you with questions regarding bills from these companies.  You will be contacted with the lab results as soon as they are available. The fastest way to get your results is to activate your My Chart account. Instructions are located on the last page of this paperwork. If you have not heard from [place] regarding the results in 2 weeks, please contact this office.

## 2017-10-04 ENCOUNTER — Encounter: Payer: Self-pay | Admitting: Family Medicine

## 2017-10-05 ENCOUNTER — Encounter: Payer: Self-pay | Admitting: Family Medicine

## 2017-10-10 ENCOUNTER — Encounter: Payer: Self-pay | Admitting: Family Medicine

## 2017-10-11 ENCOUNTER — Ambulatory Visit: Payer: PRIVATE HEALTH INSURANCE | Admitting: Urgent Care

## 2017-10-11 ENCOUNTER — Encounter: Payer: Self-pay | Admitting: Urgent Care

## 2017-10-11 VITALS — BP 116/73 | HR 70 | Temp 98.1°F | Resp 18 | Ht 71.0 in | Wt 207.6 lb

## 2017-10-11 DIAGNOSIS — M79675 Pain in left toe(s): Secondary | ICD-10-CM

## 2017-10-11 DIAGNOSIS — M109 Gout, unspecified: Secondary | ICD-10-CM | POA: Diagnosis not present

## 2017-10-11 MED ORDER — PREDNISONE 20 MG PO TABS: ORAL_TABLET | ORAL | 0 refills | Status: DC

## 2017-10-11 NOTE — Patient Instructions (Addendum)
We will try a higher dose steroid course and let you know about your labs. Please rest off of your foot, use your hard soled shoe. Contact Dr. Greig Right office for a recheck as you may need to have a CT scan done, repeat injection or other intervention if steroids do not help still.    Gout Gout is painful swelling that can happen in some of your joints. Gout is a type of arthritis. This condition is caused by having too much uric acid in your body. Uric acid is a chemical that is made when your body breaks down substances called purines. If your body has too much uric acid, sharp crystals can form and build up in your joints. This causes pain and swelling. Gout attacks can happen quickly and be very painful (acute gout). Over time, the attacks can affect more joints and happen more often (chronic gout). Follow these instructions at home: During a Gout Attack  If directed, put ice on the painful area: ? Put ice in a plastic bag. ? Place a towel between your skin and the bag. ? Leave the ice on for 20 minutes, 2-3 times a day.  Rest the joint as much as possible. If the joint is in your leg, you may be given crutches to use.  Raise (elevate) the painful joint above the level of your heart as often as you can.  Drink enough fluids to keep your pee (urine) clear or pale yellow.  Take over-the-counter and prescription medicines only as told by your doctor.  Do not drive or use heavy machinery while taking prescription pain medicine.  Follow instructions from your doctor about what you can or cannot eat and drink.  Return to your normal activities as told by your doctor. Ask your doctor what activities are safe for you. Avoiding Future Gout Attacks  Follow a low-purine diet as told by a specialist (dietitian) or your doctor. Avoid foods and drinks that have a lot of purines, such as: ? Liver. ? Kidney. ? Anchovies. ? Asparagus. ? Herring. ? Mushrooms ? Mussels. ? Beer.  Limit alcohol  intake to no more than 1 drink a day for nonpregnant women and 2 drinks a day for men. One drink equals 12 oz of beer, 5 oz of wine, or 1 oz of hard liquor.  Stay at a healthy weight or lose weight if you are overweight. If you want to lose weight, talk with your doctor. It is important that you do not lose weight too fast.  Start or continue an exercise plan as told by your doctor.  Drink enough fluids to keep your pee clear or pale yellow.  Take over-the-counter and prescription medicines only as told by your doctor.  Keep all follow-up visits as told by your doctor. This is important. Contact a doctor if:  You have another gout attack.  You still have symptoms of a gout attack after10 days of treatment.  You have problems (side effects) because of your medicines.  You have chills or a fever.  You have burning pain when you pee (urinate).  You have pain in your lower back or belly. Get help right away if:  You have very bad pain.  Your pain cannot be controlled.  You cannot pee. This information is not intended to replace advice given to you by your health care provider. Make sure you discuss any questions you have with your health care provider. Document Released: 12/28/2007 Document Revised: 08/26/2015 Document Reviewed: 12/31/2014 Elsevier  Interactive Patient Education  [employer]2018 Else[employer].     IF you received an x-ray today, you will receive an invoice from [facility]. Please contact [facility] at [phone] with questions or concerns regarding your invoice.   IF you received labwork today, you will receive an invoice from [facility]. Please contact [facility] at [phone] with questions or concerns regarding your invoice.   Our billing staff will not be able to assist you with questions regarding bills from these companies.  You will be contacted with the lab results as soon as they are available. The fastest way to get your results is to  activate your My Chart account. Instructions are located on the last page of this paperwork. If you have not heard from us regarding the results in 2 weeks, please contact this office.

## 2017-10-11 NOTE — Progress Notes (Signed)
    MRN: [id number] DOB: [birth year]  Subjective:   Andrew Foley is a 37 y.o. male presenting for 2 week history of persistent left great toe pain at 1st MTP. Patient has tried colchicine, prednisone course. Has not had any significant changes in his symptoms. He has previously seen an orthopedist, Dr. Eulah PontMurphy. He did a fluid analysis, had an injection. His symptoms resolved up until the end of June when he had a recurrence of his symptoms at work. States that he was coming down off of a ladder, felt the pain, swelling come back thereafter.   Ephriam KnucklesClifton has a current medication list which includes the following prescription(s): fluticasone and colchicine. Also is allergic to codeine.  Ephriam KnucklesClifton  has a past medical history of Abscess, Hemorrhoids, Rectal pain, and Rheumatic fever. Also  has a past surgical history that includes Pilonidal cyst excision; Adenoidectomy; Incision and drainage abscess anal (05/25/2017); and Anal examination under anesthesia (06/01/2017).  Objective:   Vitals: BP 116/73   Pulse 70   Temp 98.1 F (36.7 C) (Oral)   Resp 18   Ht 5\' 11"  (1.803 m)   Wt 207 lb 9.6 oz (94.2 kg)   SpO2 100%   BMI 28.95 kg/m   Physical Exam  Constitutional: He is oriented to person, place, and time. He appears well-developed and well-nourished.  Cardiovascular: Normal rate.  Pulmonary/Chest: Effort normal.  Musculoskeletal:       Left foot: There is decreased range of motion, tenderness (exquisite over left lateral 1st MTP), bony tenderness and swelling. There is normal capillary refill, no crepitus, no deformity and no laceration.  Neurological: He is alert and oriented to person, place, and time.  Skin: Skin is warm and dry.  Psychiatric: He has a normal mood and affect.   Assessment and Plan :   Gout, arthritis - Plan: Sedimentation rate, Uric A+[name]+RA Qn+CRP+ASO  Pain in left toe(s) - Plan: Uric A+[name]+RA Qn+CRP+ASO  Case precepted with Dr. Neva SeatGreene. Start higher dose  prednisone. Labs pending. Recheck with Dr. Eulah PontMurphy.   Wallis BambergMario Karman Veney, PA-C [facility] [phone] 10/11/2017  4:07 PM

## 2017-10-11 NOTE — Telephone Encounter (Signed)
[facility]

## 2017-10-12 ENCOUNTER — Encounter: Payer: Self-pay | Admitting: Urgent Care

## 2017-10-12 ENCOUNTER — Telehealth: Payer: Self-pay | Admitting: Urgent Care

## 2017-10-12 LAB — URIC A+ANA+RA QN+CRP+ASO
ASO: 96 [IU]/mL (ref 0.0–200.0)
Anti Nuclear Antibody(ANA): NEGATIVE
CRP: <1 mg/L (ref 0–10)
Rhuematoid fact SerPl-aCnc: <10 IU/mL (ref 0.0–13.9)
Uric Acid: 7.8 mg/dL (ref 3.7–8.6)

## 2017-10-12 LAB — SEDIMENTATION RATE: SED RATE: 7 mm/h (ref 0–15)

## 2017-10-12 NOTE — Telephone Encounter (Signed)
Refer to mychart message

## 2017-10-12 NOTE — Telephone Encounter (Signed)
Message sent to Mani 

## 2017-10-12 NOTE — Telephone Encounter (Signed)
Copied from CRM [phone]. Topic: Quick Communication - See Telephone Encounter >> Oct 12, 2017 11:11 AM Raquel SarnaHayes, Teresa G wrote: Pt still has questions and wants [facility] to give him a call back.

## 2017-10-15 ENCOUNTER — Encounter: Payer: Self-pay | Admitting: Family Medicine

## 2017-10-15 NOTE — Progress Notes (Signed)
11:45 AM 10/15/17  Phone call with Dr. Margarita Ranaimothy Murphy regarding Mr. Andrew Foley and his persistent toe pain.  Reviewed that his initial aspiration did show crystals consistent with gout, but has had persistent symptoms.  He plans on placing in a rigid insole to help with discomfort in that toe including some potential benefit if there were some component of turf toe.  If that is not improving then consideration of MRI.  If he is improving then I would start allopurinol (outside of a flare as to not worsen symptoms) as most recent uric acid reviewed and up to 7.8 from 6.9. (which ideally for gout patients should be less than 6.0).

## 2017-10-23 ENCOUNTER — Other Ambulatory Visit: Payer: Self-pay

## 2017-10-23 ENCOUNTER — Encounter: Payer: Self-pay | Admitting: Family Medicine

## 2017-10-23 ENCOUNTER — Ambulatory Visit (INDEPENDENT_AMBULATORY_CARE_PROVIDER_SITE_OTHER): Payer: PRIVATE HEALTH INSURANCE | Admitting: Family Medicine

## 2017-10-23 VITALS — BP 118/78 | HR 58 | Temp 97.8°F | Ht 71.0 in | Wt 202.4 lb

## 2017-10-23 DIAGNOSIS — Z0000 Encounter for general adult medical examination without abnormal findings: Secondary | ICD-10-CM | POA: Diagnosis not present

## 2017-10-23 DIAGNOSIS — Z113 Encounter for screening for infections with a predominantly sexual mode of transmission: Secondary | ICD-10-CM | POA: Diagnosis not present

## 2017-10-23 DIAGNOSIS — Z131 Encounter for screening for diabetes mellitus: Secondary | ICD-10-CM

## 2017-10-23 DIAGNOSIS — Z13220 Encounter for screening for lipoid disorders: Secondary | ICD-10-CM

## 2017-10-23 DIAGNOSIS — M109 Gout, unspecified: Secondary | ICD-10-CM

## 2017-10-23 MED ORDER — ALLOPURINOL 100 MG PO TABS: 100.0000 mg | ORAL_TABLET | Freq: Every day | ORAL | 6 refills | Status: AC

## 2017-10-23 NOTE — Patient Instructions (Addendum)
For gout start allopurinol 1 pill once per day in about 2 weeks as long as symptoms have improved.  Pain should continue to improve, but I will write a note for the meantime.   Recheck 6 weeks after starting allopurinol for repeat testing.   I recommend to avoid tobacco exposure, even rare use.   Schedule dentist appointment.  Keeping you healthy  Get these tests  Blood pressure- Have your blood pressure checked once a year by your healthcare provider.  Normal blood pressure is 120/80.  Weight- Have your body mass index (BMI) calculated to screen for obesity.  BMI is a measure of body fat based on height and weight. You can also calculate your own BMI at [email or web address].west-esparza.com/.  Cholesterol- Have your cholesterol checked regularly starting at age 29, sooner may be necessary if you have diabetes, high blood pressure, if a family member developed heart diseases at an early age or if you smoke.   Chlamydia, HIV, and other sexual transmitted disease- Get screened each year until the age of 34 then within three months of each new sexual partner.  Diabetes- Have your blood sugar checked regularly if you have high blood pressure, high cholesterol, a family history of diabetes or if you are overweight.  Get these vaccines  Flu shot- Every fall.  Tetanus shot- Every 10 years.  Menactra- Single dose; prevents meningitis.  Take these steps  Don't smoke- If you do smoke, ask your healthcare provider about quitting. For tips on how to quit, go to www.smokefree.gov or call 1-800-QUIT-NOW.  Be physically active- Exercise 5 days a week for at least 30 minutes.  If you are not already physically active start slow and gradually work up to 30 minutes of moderate physical activity.  Examples of moderate activity include walking briskly, mowing the yard, dancing, swimming bicycling, etc.  Eat a healthy diet- Eat a variety of healthy foods such as fruits, vegetables, low fat milk, low fat cheese,  yogurt, lean meats, poultry, fish, beans, tofu, etc.  For more information on healthy eating, go to www.thenutritionsource.org  Drink alcohol in moderation- Limit alcohol intake two drinks or less a day.  Never drink and drive.  Dentist- Brush and floss teeth twice daily; visit your dentis twice a year.  Depression-Your emotional health is as important as your physical health.  If you're feeling down, losing interest in things you normally enjoy please talk with your healthcare provider.  Gun Safety- If you keep a gun in your home, keep it unloaded and with the safety lock on.  Bullets should be stored separately.  Helmet use- Always wear a helmet when riding a motorcycle, bicycle, rollerblading or skateboarding.  Safe sex- If you may be exposed to a sexually transmitted infection, use a condom  Seat belts- Seat bels can save your life; always wear one.  Smoke/Carbon Monoxide detectors- These detectors need to be installed on the appropriate level of your home.  Replace batteries at least once a year.  Skin Cancer- When out in the sun, cover up and use sunscreen SPF 15 or higher.  Violence- If anyone is threatening or hurting you, please tell your healthcare provider.     Gout Gout is painful swelling that can occur in some of your joints. Gout is a type of arthritis. This condition is caused by having too much uric acid in your body. Uric acid is a chemical that forms when your body breaks down substances called purines. Purines are important for building body proteins.  When your body has too much uric acid, sharp crystals can form and build up inside your joints. This causes pain and swelling. Gout attacks can happen quickly and be very painful (acute gout). Over time, the attacks can affect more joints and become more frequent (chronic gout). Gout can also cause uric acid to build up under your skin and inside your kidneys. What are the causes? This condition is caused by too much  uric acid in your blood. This can occur because:  Your kidneys do not remove enough uric acid from your blood. This is the most common cause.  Your body makes too much uric acid. This can occur with some cancers and cancer treatments. It can also occur if your body is breaking down too many red blood cells (hemolytic anemia).  You eat too many foods that are high in purines. These foods include organ meats and some seafood. Alcohol, especially beer, is also high in purines.  A gout attack may be triggered by trauma or stress. What increases the risk? This condition is more likely to develop in people who:  Have a family history of gout.  Are male and middle-aged.  Are male and have gone through menopause.  Are obese.  Frequently drink alcohol, especially beer.  Are dehydrated.  Lose weight too quickly.  Have an organ transplant.  Have lead poisoning.  Take certain medicines, including aspirin, cyclosporine, diuretics, levodopa, and niacin.  Have kidney disease or psoriasis.  What are the signs or symptoms? An attack of acute gout happens quickly. It usually occurs in just one joint. The most common place is the big toe. Attacks often start at night. Other joints that may be affected include joints of the feet, ankle, knee, fingers, wrist, or elbow. Symptoms may include:  Severe pain.  Warmth.  Swelling.  Stiffness.  Tenderness. The affected joint may be very painful to touch.  Shiny, red, or purple skin.  Chills and fever.  Chronic gout may cause symptoms more frequently. More joints may be involved. You may also have white or yellow lumps (tophi) on your hands or feet or in other areas near your joints. How is this diagnosed? This condition is diagnosed based on your symptoms, medical history, and physical exam. You may have tests, such as:  Blood tests to measure uric acid levels.  Removal of joint fluid with a needle (aspiration) to look for uric acid  crystals.  X-rays to look for joint damage.  How is this treated? Treatment for this condition has two phases: treating an acute attack and preventing future attacks. Acute gout treatment may include medicines to reduce pain and swelling, including:  NSAIDs.  Steroids. These are strong anti-inflammatory medicines that can be taken by mouth (orally) or injected into a joint.  Colchicine. This medicine relieves pain and swelling when it is taken soon after an attack. It can be given orally or through an IV tube.  Preventive treatment may include:  Daily use of smaller doses of NSAIDs or colchicine.  Use of a medicine that reduces uric acid levels in your blood.  Changes to your diet. You may need to see a specialist about healthy eating (dietitian).  Follow these instructions at home: During a Gout Attack  If directed, apply ice to the affected area: ? Put ice in a plastic bag. ? Place a towel between your skin and the bag. ? Leave the ice on for 20 minutes, 2-3 times a day.  Rest the joint as  much as possible. If the affected joint is in your leg, you may be given crutches to use.  Raise (elevate) the affected joint above the level of your heart as often as possible.  Drink enough fluids to keep your urine clear or pale yellow.  Take over-the-counter and prescription medicines only as told by your health care provider.  Do not drive or operate heavy machinery while taking prescription pain medicine.  Follow instructions from your health care provider about eating or drinking restrictions.  Return to your normal activities as told by your health care provider. Ask your health care provider what activities are safe for you. Avoiding Future Gout Attacks  Follow a low-purine diet as told by your dietitian or health care provider. Avoid foods and drinks that are high in purines, including liver, kidney, anchovies, asparagus, herring, mushrooms, mussels, and beer.  Limit  alcohol intake to no more than 1 drink a day for nonpregnant women and 2 drinks a day for men. One drink equals 12 oz of beer, 5 oz of wine, or 1 oz of hard liquor.  Maintain a healthy weight or lose weight if you are overweight. If you want to lose weight, talk with your health care provider. It is important that you do not lose weight too quickly.  Start or maintain an exercise program as told by your health care provider.  Drink enough fluids to keep your urine clear or pale yellow.  Take over-the-counter and prescription medicines only as told by your health care provider.  Keep all follow-up visits as told by your health care provider. This is important. Contact a health care provider if:  You have another gout attack.  You continue to have symptoms of a gout attack after10 days of treatment.  You have side effects from your medicines.  You have chills or a fever.  You have burning pain when you urinate.  You have pain in your lower back or belly. Get help right away if:  You have severe or uncontrolled pain.  You cannot urinate. This information is not intended to replace advice given to you by your health care provider. Make sure you discuss any questions you have with your health care provider. Document Released: 03/17/2000 Document Revised: 08/26/2015 Document Reviewed: 12/31/2014 [employer] Interactive Patient Education  2018 [employer]    IF you received an x-ray today, you will receive an invoice from [facility]. Please contact [facility] at [phone] with questions or concerns regarding your invoice.   IF you received labwork today, you will receive an invoice from [facility]. Please contact [facility] at [phone] with questions or concerns regarding your invoice.   Our billing staff will not be able to assist you with questions regarding bills from these companies.  You will be contacted with the lab results as soon as they are  available. The fastest way to get your results is to activate your My Chart account. Instructions are located on the last page of this paperwork. If you have not heard from us regarding the results in 2 weeks, please contact this office.

## 2017-10-23 NOTE — Progress Notes (Signed)
Subjective:  By signing my name below, I, Andrew Foley, attest that this documentation has been prepared under the direction and in the presence of [name], MD Electronically Signed: Charline Bills, ED Scribe 10/23/2017 at 8:21 AM.   Patient ID: Andrew Foley, male    DOB: [birth year], 37 y.o.   MRN: [id number]  Chief Complaint  Patient presents with  . Annual Exam    CPE   HPI Andrew Foley is a 37 y.o. male who presents to Primary Care at [facility] for an annual exam. Treated for L foot pain here and with ortho. Suspected gout with crystals on initial aspiration by ortho. Has had recurrent pain. Uric acid slightly elevated at 7.8 most recently 7/11. Spoke to ortho 7/15, plan for placing rigid insole incase turf toe if not helping consider MRI. If improving plan for allopurinol once flare has improved. Normal sed rate, [name], ASO, CRP, rheumatoid factor 7/11. Treated in March for perianal abscess. Followed by hemorrhagic shock from post-op rectal bleeding by Dr. Buzzy Han in [facility] Appointment 4/15. Doing well post-operatively. Returned to full duty in May. - Pt still reports some soreness in his foot but states he is improving. States he is currently doing light-duty at work and not on his feet much. Also reports that he has been on a strict diet lately to try to prevent another flare-up. Pt smokes cigars ~once every 6 months.  Wt Readings from Last 3 Encounters:  10/23/17 202 lb 6.4 oz (91.8 kg)  10/11/17 207 lb 9.6 oz (94.2 kg)  10/01/17 209 lb 9.6 oz (95.1 kg)   Immunizations Immunization History  Administered Date(s) Administered  . Pneumococcal Polysaccharide-23 10/16/2016  . Tdap 07/12/2017   Depression Screening Depression screen [facility] 2/9 10/23/2017 10/01/2017 07/12/2017 06/19/2017  Decreased Interest 0 0 0 0  Down, Depressed, Hopeless 0 0 0 0  PHQ - 2 Score 0 0 0 0    Visual Acuity Screening   Right eye Left eye Both eyes  Without correction: 20/25 20/20 20/20     With correction:      Vision: not regularly; does not wear corrective lenses Dentist: not regularly Exercise: 2-3 days/wk, walks 1 mile every other day  STI screening: Pt is married/in a monogamous relationship. He agrees to STI screening at this time. Denies pain in groin, rash, lumps/bumps.  Review of Systems    Objective:   Physical Exam  Constitutional: He is oriented to person, place, and time. He appears well-developed and well-nourished.  HENT:  Head: Normocephalic and atraumatic.  Right Ear: External ear normal.  Left Ear: External ear normal.  Mouth/Throat: Oropharynx is clear and moist.  Eyes: Pupils are equal, round, and reactive to light. Conjunctivae and EOM are normal.  Neck: Normal range of motion. Neck supple. No thyromegaly present.  Cardiovascular: Normal rate, regular rhythm, normal heart sounds and intact distal pulses.  Pulmonary/Chest: Effort normal and breath sounds normal. No respiratory distress. He has no wheezes.  Abdominal: Soft. He exhibits no distension. There is no tenderness.  Musculoskeletal: Normal range of motion. He exhibits no edema or tenderness.  Lymphadenopathy:    He has no cervical adenopathy.  Neurological: He is alert and oriented to person, place, and time. He has normal reflexes.  Skin: Skin is warm and dry.  Psychiatric: He has a normal mood and affect. His behavior is normal.  Vitals reviewed.    Vitals:   10/23/17 0800  BP: 118/78  Pulse: (!) 58  Temp: 97.8  F (36.6 C)  TempSrc: Oral  SpO2: 100%  Weight: 202 lb 6.4 oz (91.8 kg)  Height: 5\' 11"  (1.803 m)      Assessment & Plan:   [name] is a 37 y.o. male Annual physical exam  - -anticipatory guidance as below in AVS, screening labs above. Health maintenance items as above in HPI discussed/recommended as applicable.   Routine screening for STI (sexually transmitted infection) - Plan: RPR, GC/Chlamydia Probe Amp, HIV antibody  - denies new risks, but would  like testing.   Gout involving toe of left foot, unspecified cause, unspecified chronicity - Plan: allopurinol (ZYLOPRIM) 100 MG tablet  -Initial aspiration with crystals, and symptoms consistent with gout.  Has had recurrent symptoms.  Now in postop shoe with rigid orthosis.  Reports some improvement.  If he continues to improve, then plan on allopurinol the next few weeks once symptoms have resolved with follow-up testing in 6 weeks.  Potential side effects, risks of prescription medication discussed.  RTC precautions if worse, or follow-up with Ortho for possible MRI to further delineate that area.  Screening for hyperlipidemia - Plan: Lipid panel  Screening for diabetes mellitus - Plan: Comprehensive metabolic panel   Meds ordered this encounter  Medications  . allopurinol (ZYLOPRIM) 100 MG tablet    Sig: Take 1 tablet (100 mg total) by mouth daily.    Dispense:  30 tablet    Refill:  6   Patient Instructions   For gout start allopurinol 1 pill once per day in about 2 weeks as long as symptoms have improved.  Pain should continue to improve, but I will write a note for the meantime.   Recheck 6 weeks after starting allopurinol for repeat testing.   I recommend to avoid tobacco exposure, even rare use.   Schedule dentist appointment.  Keeping you healthy  Get these tests  Blood pressure- Have your blood pressure checked once a year by your healthcare provider.  Normal blood pressure is 120/80.  Weight- Have your body mass index (BMI) calculated to screen for obesity.  BMI is a measure of body fat based on height and weight. You can also calculate your own BMI at https://www.west-esparza.com/.  Cholesterol- Have your cholesterol checked regularly starting at age 29, sooner may be necessary if you have diabetes, high blood pressure, if a family member developed heart diseases at an early age or if you smoke.   Chlamydia, HIV, and other sexual transmitted disease- Get screened each  year until the age of 33 then within three months of each new sexual partner.  Diabetes- Have your blood sugar checked regularly if you have high blood pressure, high cholesterol, a family history of diabetes or if you are overweight.  Get these vaccines  Flu shot- Every fall.  Tetanus shot- Every 10 years.  Menactra- Single dose; prevents meningitis.  Take these steps  Don't smoke- If you do smoke, ask your healthcare provider about quitting. For tips on how to quit, go to www.smokefree.gov or call 1-800-QUIT-NOW.  Be physically active- Exercise 5 days a week for at least 30 minutes.  If you are not already physically active start slow and gradually work up to 30 minutes of moderate physical activity.  Examples of moderate activity include walking briskly, mowing the yard, dancing, swimming bicycling, etc.  Eat a healthy diet- Eat a variety of healthy foods such as fruits, vegetables, low fat milk, low fat cheese, yogurt, lean meats, poultry, fish, beans, tofu, etc.  For  more information on healthy eating, go to www.thenutritionsource.org  Drink alcohol in moderation- Limit alcohol intake two drinks or less a day.  Never drink and drive.  Dentist- Brush and floss teeth twice daily; visit your dentis twice a year.  Depression-Your emotional health is as important as your physical health.  If you're feeling down, losing interest in things you normally enjoy please talk with your healthcare provider.  Gun Safety- If you keep a gun in your home, keep it unloaded and with the safety lock on.  Bullets should be stored separately.  Helmet use- Always wear a helmet when riding a motorcycle, bicycle, rollerblading or skateboarding.  Safe sex- If you may be exposed to a sexually transmitted infection, use a condom  Seat belts- Seat bels can save your life; always wear one.  Smoke/Carbon Monoxide detectors- These detectors need to be installed on the appropriate level of your home.  Replace  batteries at least once a year.  Skin Cancer- When out in the sun, cover up and use sunscreen SPF 15 or higher.  Violence- If anyone is threatening or hurting you, please tell your healthcare provider.     Gout Gout is painful swelling that can occur in some of your joints. Gout is a type of arthritis. This condition is caused by having too much uric acid in your body. Uric acid is a chemical that forms when your body breaks down substances called purines. Purines are important for building body proteins. When your body has too much uric acid, sharp crystals can form and build up inside your joints. This causes pain and swelling. Gout attacks can happen quickly and be very painful (acute gout). Over time, the attacks can affect more joints and become more frequent (chronic gout). Gout can also cause uric acid to build up under your skin and inside your kidneys. What are the causes? This condition is caused by too much uric acid in your blood. This can occur because:  Your kidneys do not remove enough uric acid from your blood. This is the most common cause.  Your body makes too much uric acid. This can occur with some cancers and cancer treatments. It can also occur if your body is breaking down too many red blood cells (hemolytic anemia).  You eat too many foods that are high in purines. These foods include organ meats and some seafood. Alcohol, especially beer, is also high in purines.  A gout attack may be triggered by trauma or stress. What increases the risk? This condition is more likely to develop in people who:  Have a family history of gout.  Are male and middle-aged.  Are male and have gone through menopause.  Are obese.  Frequently drink alcohol, especially beer.  Are dehydrated.  Lose weight too quickly.  Have an organ transplant.  Have lead poisoning.  Take certain medicines, including aspirin, cyclosporine, diuretics, levodopa, and niacin.  Have kidney  disease or psoriasis.  What are the signs or symptoms? An attack of acute gout happens quickly. It usually occurs in just one joint. The most common place is the big toe. Attacks often start at night. Other joints that may be affected include joints of the feet, ankle, knee, fingers, wrist, or elbow. Symptoms may include:  Severe pain.  Warmth.  Swelling.  Stiffness.  Tenderness. The affected joint may be very painful to touch.  Shiny, red, or purple skin.  Chills and fever.  Chronic gout may cause symptoms more frequently. More joints  may be involved. You may also have white or yellow lumps (tophi) on your hands or feet or in other areas near your joints. How is this diagnosed? This condition is diagnosed based on your symptoms, medical history, and physical exam. You may have tests, such as:  Blood tests to measure uric acid levels.  Removal of joint fluid with a needle (aspiration) to look for uric acid crystals.  X-rays to look for joint damage.  How is this treated? Treatment for this condition has two phases: treating an acute attack and preventing future attacks. Acute gout treatment may include medicines to reduce pain and swelling, including:  NSAIDs.  Steroids. These are strong anti-inflammatory medicines that can be taken by mouth (orally) or injected into a joint.  Colchicine. This medicine relieves pain and swelling when it is taken soon after an attack. It can be given orally or through an IV tube.  Preventive treatment may include:  Daily use of smaller doses of NSAIDs or colchicine.  Use of a medicine that reduces uric acid levels in your blood.  Changes to your diet. You may need to see a specialist about healthy eating (dietitian).  Follow these instructions at home: During a Gout Attack  If directed, apply ice to the affected area: ? Put ice in a plastic bag. ? Place a towel between your skin and the bag. ? Leave the ice on for 20 minutes, 2-3  times a day.  Rest the joint as much as possible. If the affected joint is in your leg, you may be given crutches to use.  Raise (elevate) the affected joint above the level of your heart as often as possible.  Drink enough fluids to keep your urine clear or pale yellow.  Take over-the-counter and prescription medicines only as told by your health care provider.  Do not drive or operate heavy machinery while taking prescription pain medicine.  Follow instructions from your health care provider about eating or drinking restrictions.  Return to your normal activities as told by your health care provider. Ask your health care provider what activities are safe for you. Avoiding Future Gout Attacks  Follow a low-purine diet as told by your dietitian or health care provider. Avoid foods and drinks that are high in purines, including liver, kidney, anchovies, asparagus, herring, mushrooms, mussels, and beer.  Limit alcohol intake to no more than 1 drink a day for nonpregnant women and 2 drinks a day for men. One drink equals 12 oz of beer, 5 oz of wine, or 1 oz of hard liquor.  Maintain a healthy weight or lose weight if you are overweight. If you want to lose weight, talk with your health care provider. It is important that you do not lose weight too quickly.  Start or maintain an exercise program as told by your health care provider.  Drink enough fluids to keep your urine clear or pale yellow.  Take over-the-counter and prescription medicines only as told by your health care provider.  Keep all follow-up visits as told by your health care provider. This is important. Contact a health care provider if:  You have another gout attack.  You continue to have symptoms of a gout attack after10 days of treatment.  You have side effects from your medicines.  You have chills or a fever.  You have burning pain when you urinate.  You have pain in your lower back or belly. Get help right  away if:  You have severe or  uncontrolled pain.  You cannot urinate. This information is not intended to replace advice given to you by your health care provider. Make sure you discuss any questions you have with your health care provider. Document Released: 03/17/2000 Document Revised: 08/26/2015 Document Reviewed: 12/31/2014 Elsevier Interactive Patient Education  2018 [employer].    IF you received an x-ray today, you will receive an invoice from [facility]. Please contact [facility] at [phone] with questions or concerns regarding your invoice.   IF you received labwork today, you will receive an invoice from [facility]. Please contact [facility] at [phone] with questions or concerns regarding your invoice.   Our billing staff will not be able to assist you with questions regarding bills from these companies.  You will be contacted with the lab results as soon as they are available. The fastest way to get your results is to activate your My Chart account. Instructions are located on the last page of this paperwork. If you have not heard from [place] regarding the results in 2 weeks, please contact this office.       I personally performed the services described in this documentation, which was scribed in my presence. The recorded information has been reviewed and considered for accuracy and completeness, addended by me as needed, and agree with information above.  Signed,   [name], MD Primary Care at [facility].  10/23/17 8:36 AM

## 2017-10-24 LAB — HIV ANTIBODY (ROUTINE TESTING W REFLEX): HIV SCREEN 4TH GENERATION: NONREACTIVE

## 2017-10-24 LAB — COMPREHENSIVE METABOLIC PANEL
A/G RATIO: 1.7 (ref 1.2–2.2)
ALT: 27 IU/L (ref 0–44)
AST: 20 IU/L (ref 0–40)
Albumin: 4.8 g/dL (ref 3.5–5.5)
Alkaline Phosphatase: 61 IU/L (ref 39–117)
BUN/Creatinine Ratio: 10 (ref 9–20)
BUN: 11 mg/dL (ref 6–20)
Bilirubin Total: 1.5 mg/dL — ABNORMAL HIGH (ref 0.0–1.2)
CO2: 24 mmol/L (ref 20–29)
Calcium: 10.1 mg/dL (ref 8.7–10.2)
Chloride: 101 mmol/L (ref 96–106)
Creatinine, Ser: 1.15 mg/dL (ref 0.76–1.27)
GFR calc Af Amer: 93 mL/min/{1.73_m2} (ref >59)
GFR calc non Af Amer: 80 mL/min/{1.73_m2} (ref >59)
GLOBULIN, TOTAL: 2.9 g/dL (ref 1.5–4.5)
Glucose: 94 mg/dL (ref 65–99)
Potassium: 4.4 mmol/L (ref 3.5–5.2)
Sodium: 141 mmol/L (ref 134–144)
Total Protein: 7.7 g/dL (ref 6.0–8.5)

## 2017-10-24 LAB — LIPID PANEL
CHOL/HDL RATIO: 3.8 ratio (ref 0.0–5.0)
CHOLESTEROL TOTAL: 188 mg/dL (ref 100–199)
HDL: 49 mg/dL (ref >39)
LDL CALC: 116 mg/dL — AB (ref 0–99)
TRIGLYCERIDES: 116 mg/dL (ref 0–149)
VLDL Cholesterol Cal: 23 mg/dL (ref 5–40)

## 2017-10-24 LAB — GC/CHLAMYDIA PROBE AMP
Chlamydia trachomatis, NAA: NEGATIVE
NEISSERIA GONORRHOEAE BY PCR: NEGATIVE

## 2017-10-24 LAB — RPR: RPR Ser Ql: NONREACTIVE

## 2017-10-26 ENCOUNTER — Encounter: Payer: Self-pay | Admitting: Family Medicine

## 2017-10-28 ENCOUNTER — Other Ambulatory Visit: Payer: Self-pay | Admitting: Family Medicine

## 2017-10-28 ENCOUNTER — Encounter: Payer: Self-pay | Admitting: Family Medicine

## 2017-10-28 DIAGNOSIS — M109 Gout, unspecified: Secondary | ICD-10-CM

## 2017-10-28 DIAGNOSIS — M79675 Pain in left toe(s): Secondary | ICD-10-CM

## 2017-11-01 ENCOUNTER — Encounter: Payer: Self-pay | Admitting: Family Medicine

## 2017-11-09 ENCOUNTER — Encounter: Payer: Self-pay | Admitting: Family Medicine

## 2017-12-24 ENCOUNTER — Ambulatory Visit: Payer: PRIVATE HEALTH INSURANCE | Admitting: Family Medicine

## 2018-05-14 ENCOUNTER — Emergency Department (HOSPITAL_COMMUNITY): Payer: PRIVATE HEALTH INSURANCE

## 2018-05-14 ENCOUNTER — Other Ambulatory Visit: Payer: Self-pay

## 2018-05-14 ENCOUNTER — Encounter (HOSPITAL_COMMUNITY): Payer: Self-pay | Admitting: *Deleted

## 2018-05-14 ENCOUNTER — Inpatient Hospital Stay (HOSPITAL_COMMUNITY)
Admission: EM | Admit: 2018-05-14 | Discharge: 2018-05-16 | DRG: 069 | Disposition: A | Discharge status: Home / Self Care | Payer: PRIVATE HEALTH INSURANCE | Attending: Family Medicine | Admitting: Family Medicine

## 2018-05-14 DIAGNOSIS — R29700 NIHSS score 0: Secondary | ICD-10-CM | POA: Diagnosis present

## 2018-05-14 DIAGNOSIS — R42 Dizziness and giddiness: Secondary | ICD-10-CM

## 2018-05-14 DIAGNOSIS — G459 Transient cerebral ischemic attack, unspecified: Principal | ICD-10-CM | POA: Diagnosis present

## 2018-05-14 DIAGNOSIS — Z713 Dietary counseling and surveillance: Secondary | ICD-10-CM

## 2018-05-14 DIAGNOSIS — Z79899 Other long term (current) drug therapy: Secondary | ICD-10-CM

## 2018-05-14 DIAGNOSIS — F17290 Nicotine dependence, other tobacco product, uncomplicated: Secondary | ICD-10-CM | POA: Diagnosis present

## 2018-05-14 DIAGNOSIS — R202 Paresthesia of skin: Secondary | ICD-10-CM

## 2018-05-14 DIAGNOSIS — R200 Anesthesia of skin: Secondary | ICD-10-CM

## 2018-05-14 DIAGNOSIS — Z7951 Long term (current) use of inhaled steroids: Secondary | ICD-10-CM

## 2018-05-14 DIAGNOSIS — Z885 Allergy status to narcotic agent status: Secondary | ICD-10-CM

## 2018-05-14 DIAGNOSIS — Z8619 Personal history of other infectious and parasitic diseases: Secondary | ICD-10-CM

## 2018-05-14 DIAGNOSIS — Z8349 Family history of other endocrine, nutritional and metabolic diseases: Secondary | ICD-10-CM

## 2018-05-14 DIAGNOSIS — M109 Gout, unspecified: Secondary | ICD-10-CM | POA: Diagnosis present

## 2018-05-14 LAB — PROTIME-INR
INR: 0.99
Prothrombin Time: 13 seconds (ref 11.4–15.2)

## 2018-05-14 LAB — COMPREHENSIVE METABOLIC PANEL
ALT: 27 U/L (ref 0–44)
AST: 26 U/L (ref 15–41)
Albumin: 4.7 g/dL (ref 3.5–5.0)
Alkaline Phosphatase: 57 U/L (ref 38–126)
Anion gap: 6 (ref 5–15)
BILIRUBIN TOTAL: 1.5 mg/dL — AB (ref 0.3–1.2)
BUN: 17 mg/dL (ref 6–20)
CO2: 26 mmol/L (ref 22–32)
Calcium: 9.2 mg/dL (ref 8.9–10.3)
Chloride: 107 mmol/L (ref 98–111)
Creatinine, Ser: 1.16 mg/dL (ref 0.61–1.24)
GFR calc Af Amer: >60 mL/min (ref >60)
Glucose, Bld: 99 mg/dL (ref 70–99)
Potassium: 4 mmol/L (ref 3.5–5.1)
Sodium: 139 mmol/L (ref 135–145)
Total Protein: 8.1 g/dL (ref 6.5–8.1)

## 2018-05-14 LAB — CBG MONITORING, ED: GLUCOSE-CAPILLARY: 79 mg/dL (ref 70–99)

## 2018-05-14 LAB — CBC
HCT: 44 % (ref 39.0–52.0)
Hemoglobin: 13.8 g/dL (ref 13.0–17.0)
MCH: 28.6 pg (ref 26.0–34.0)
MCHC: 31.4 g/dL (ref 30.0–36.0)
MCV: 91.1 fL (ref 80.0–100.0)
PLATELETS: 264 10*3/uL (ref 150–400)
RBC: 4.83 MIL/uL (ref 4.22–5.81)
RDW: 13.8 % (ref 11.5–15.5)
WBC: 5 10*3/uL (ref 4.0–10.5)
nRBC: 0 % (ref 0.0–0.2)

## 2018-05-14 LAB — APTT: aPTT: 31 seconds (ref 24–36)

## 2018-05-14 LAB — DIFFERENTIAL
Abs Immature Granulocytes: 0.01 10*3/uL (ref 0.00–0.07)
Basophils Absolute: 0 10*3/uL (ref 0.0–0.1)
Basophils Relative: 0 %
Eosinophils Absolute: 0 10*3/uL (ref 0.0–0.5)
Eosinophils Relative: 1 %
Immature Granulocytes: 0 %
LYMPHS ABS: 1.3 10*3/uL (ref 0.7–4.0)
Lymphocytes Relative: 27 %
MONO ABS: 0.4 10*3/uL (ref 0.1–1.0)
Monocytes Relative: 8 %
Neutro Abs: 3.2 10*3/uL (ref 1.7–7.7)
Neutrophils Relative %: 64 %

## 2018-05-14 MED ORDER — COLCHICINE 0.6 MG PO TABS
0.6000 mg | ORAL_TABLET | Freq: Every day | ORAL | Status: DC | PRN
  Filled 2018-05-14: qty 1

## 2018-05-14 MED ORDER — STROKE: EARLY STAGES OF RECOVERY BOOK
Freq: Once | Status: DC
  Filled 2018-05-14: qty 1

## 2018-05-14 MED ORDER — ATORVASTATIN CALCIUM 40 MG PO TABS
40.0000 mg | ORAL_TABLET | Freq: Every day | ORAL | Status: DC
  Administered 2018-05-15: 40 mg via ORAL
  Filled 2018-05-14: qty 1

## 2018-05-14 MED ORDER — ACETAMINOPHEN 160 MG/5ML PO SOLN: 650.0000 mg | ORAL | Status: DC | PRN

## 2018-05-14 MED ORDER — SODIUM CHLORIDE 0.9 % IV BOLUS
1000.0000 mL | Freq: Once | INTRAVENOUS | Status: AC
  Administered 2018-05-14: 1000 mL via INTRAVENOUS

## 2018-05-14 MED ORDER — ACETAMINOPHEN 325 MG PO TABS: 650.0000 mg | ORAL_TABLET | ORAL | Status: DC | PRN

## 2018-05-14 MED ORDER — ASPIRIN 325 MG PO TABS
325.0000 mg | ORAL_TABLET | Freq: Every day | ORAL | Status: DC
  Administered 2018-05-15: 325 mg via ORAL
  Filled 2018-05-14: qty 1

## 2018-05-14 MED ORDER — SENNOSIDES-DOCUSATE SODIUM 8.6-50 MG PO TABS: 1.0000 | ORAL_TABLET | Freq: Every evening | ORAL | Status: DC | PRN

## 2018-05-14 MED ORDER — ACETAMINOPHEN 650 MG RE SUPP: 650.0000 mg | RECTAL | Status: DC | PRN

## 2018-05-14 MED ORDER — IOPAMIDOL (ISOVUE-370) INJECTION 76%
INTRAVENOUS | Status: AC
  Filled 2018-05-14: qty 100

## 2018-05-14 MED ORDER — ASPIRIN 300 MG RE SUPP
300.0000 mg | Freq: Every day | RECTAL | Status: DC
  Filled 2018-05-14: qty 1

## 2018-05-14 MED ORDER — IOPAMIDOL (ISOVUE-370) INJECTION 76%
80.0000 mL | Freq: Once | INTRAVENOUS | Status: AC | PRN
  Administered 2018-05-14: 80 mL via INTRAVENOUS

## 2018-05-14 MED ORDER — SODIUM CHLORIDE 0.9 % IV SOLN: INTRAVENOUS | Status: DC

## 2018-05-14 MED ORDER — SODIUM CHLORIDE (PF) 0.9 % IJ SOLN
INTRAMUSCULAR | Status: AC
  Filled 2018-05-14: qty 50

## 2018-05-14 MED ORDER — CLOPIDOGREL BISULFATE 75 MG PO TABS
75.0000 mg | ORAL_TABLET | Freq: Every day | ORAL | Status: DC
  Filled 2018-05-14: qty 1

## 2018-05-14 MED ORDER — ENOXAPARIN SODIUM 40 MG/0.4ML ~~LOC~~ SOLN: 40.0000 mg | SUBCUTANEOUS | Status: DC

## 2018-05-14 MED ORDER — FLUTICASONE PROPIONATE 50 MCG/ACT NA SUSP
2.0000 | Freq: Every day | NASAL | Status: DC
  Administered 2018-05-16: 2 via NASAL
  Filled 2018-05-14: qty 16

## 2018-05-14 MED ORDER — ALLOPURINOL 100 MG PO TABS
100.0000 mg | ORAL_TABLET | Freq: Every day | ORAL | Status: DC
  Administered 2018-05-15 – 2018-05-16 (×2): 100 mg via ORAL
  Filled 2018-05-14 (×2): qty 1

## 2018-05-14 MED ORDER — ASPIRIN 325 MG PO TABS
325.0000 mg | ORAL_TABLET | Freq: Every day | ORAL | Status: DC
  Administered 2018-05-16: 325 mg via ORAL
  Filled 2018-05-14: qty 1

## 2018-05-14 NOTE — Consult Note (Addendum)
Neurology Consultation  Reason for Consult: Concern for TIA due to right-sided numbness Referring Physician: Dr. Mikeal HawthorneGarba  CC: Right-sided numbness  History is obtained from: Patient, chart  HPI: Andrew Foley is a 38 y.o. male with history of rheumatic fever as a child, presenting to the emergency room for evaluation of dizziness that he describes as lightheadedness as well as right upper extremity and lower extremity numbness that started around 10 AM. He woke up normal, was doing fine was at work and started noticing some dizziness which she describes as feeling of lightheadedness.  He did not pass out or lose consciousness.  He then noticed that his right arm was numb.  This lasted for a few minutes and then resolved.  Then later on he noticed his right leg being numb.  This also resolved and.  Then the symptoms of right arm and leg numbness recurred.  He was symptomatic off and on from 10 AM to 7 PM today.  His symptoms finally resolved around 7 PM. Denies any speech changes or facial weakness.  Denies any headache preceding or following the symptoms. [facility], and noncontrast CT of the head was done which was negative for any acute change.  Neurology was consulted over the phone.  Further imaging in the form of CTA and MRI were recommended.  Both of them were done and unremarkable. At this time patient has no complaints. He denies any history of headaches.  Denies any prior history of strokes.  Denies any history of chest pain nausea vomiting.  Denies any history of heart murmur.   LKW: 10 AM on 05/14/2018 tpa given?: no, outside the window Premorbid modified Rankin scale (mRS): 0  ROS: ROS was performed and is negative except as noted in the HPI.   Past Medical History:  Diagnosis Date  . Abscess   . Hemorrhoids   . Rectal pain   . Rheumatic fever    as a child    Family History  Problem Relation Age of Onset  . Hyperlipidemia  Father   . Hyperlipidemia Brother    Social History:   reports that he has been smoking cigars. He has never used smokeless tobacco. He reports current alcohol use of about 20.0 standard drinks of alcohol per week. He reports that he does not use drugs.  Medications  Current Facility-Administered Medications:  .   stroke: mapping our early stages of recovery book, , Does not apply, Once, Garba, Mohammad L, MD .  0.9 %  sodium chloride infusion, , Intravenous, Continuous, Garba, Mohammad L, MD .  acetaminophen (TYLENOL) tablet 650 mg, 650 mg, Oral, Q4H PRN **OR** acetaminophen (TYLENOL) solution 650 mg, 650 mg, Per Tube, Q4H PRN **OR** acetaminophen (TYLENOL) suppository 650 mg, 650 mg, Rectal, Q4H PRN, Mikeal HawthorneGarba, Mohammad L, MD .  allopurinol (ZYLOPRIM) tablet 100 mg, 100 mg, Oral, Daily, Garba, Mohammad L, MD .  aspirin suppository 300 mg, 300 mg, Rectal, Daily **OR** aspirin tablet 325 mg, 325 mg, Oral, Daily, Garba, Mohammad L, MD .  aspirin tablet 325 mg, 325 mg, Oral, Daily, [place], Carley D, DO .  [START ON 05/15/2018] atorvastatin (LIPITOR) tablet 40 mg, 40 mg, Oral, q1800, Garba, Mohammad L, MD .  clopidogrel (PLAVIX) tablet 75 mg, 75 mg, Oral, Daily, Garba, Mohammad L, MD .  colchicine tablet 0.6 mg, 0.6 mg, Oral, Daily PRN, Mikeal HawthorneGarba, Mohammad L, MD .  fluticasone (FLONASE) 50 MCG/ACT nasal spray 2 spray, 2 spray,  Each Nare, Daily, Garba, Mohammad L, MD .  senna-docusate (Senokot-S) tablet 1 tablet, 1 tablet, Oral, QHS PRN, Mikeal Hawthorne, Mohammad L, MD .  sodium chloride (PF) 0.9 % injection, , , ,   Current Outpatient Medications:  .  allopurinol (ZYLOPRIM) 100 MG tablet, Take 1 tablet (100 mg total) by mouth daily., Disp: 30 tablet, Rfl: 6 .  clindamycin (CLEOCIN T) 1 % external solution, Apply 1 application topically 2 (two) times daily. , Disp: , Rfl:  .  colchicine 0.6 MG tablet, Take 1 tablet (0.6 mg total) by mouth daily as needed (with gout flare.)., Disp: 30 tablet, Rfl: 0 .   fluticasone (FLONASE) 50 MCG/ACT nasal spray, Place 2 sprays into both nostrils daily., Disp: 16 g, Rfl: 6 .  predniSONE (DELTASONE) 20 MG tablet, Day 1-3: take 3 tablets daily. Day 4-6: take 2 tablets daily. Day 7-9: Take 1 tablet daily. Take medications with breakfast., Disp: 18 tablet, Rfl: 0   Exam: Current vital signs: BP 133/85 (BP Location: Left Arm)   Pulse 73   Temp 98.3 F (36.8 C) (Oral)   Resp 18   Ht 6' (1.829 m)   Wt 83.9 kg   SpO2 100%   BMI 25.09 kg/m  Vital signs in last 24 hours: Temp:  [98.3 F (36.8 C)] 98.3 F (36.8 C) (02/11 1648) Pulse Rate:  [73] 73 (02/11 1648) Resp:  [18] 18 (02/11 1648) BP: (133)/(85) 133/85 (02/11 1648) SpO2:  [100 %] 100 % (02/11 1648) Weight:  [83.9 kg] 83.9 kg (02/11 1648)  GENERAL: Awake, alert in NAD HEENT: - Normocephalic and atraumatic, dry mm, no LN++, no Thyromegally LUNGS - Clear to auscultation bilaterally with no wheezes CV - S1S2 RRR, no m/r/g, equal pulses bilaterally. ABDOMEN - Soft, nontender, nondistended with normoactive BS Ext: warm, well perfused, intact peripheral pulses, no edema  NEURO:  Mental Status: AA&Ox3  Language: speech is non-dysarthric.  Naming, repetition, fluency, and comprehension intact. Cranial Nerves: PERRL . EOMI, visual fields full, no facial asymmetry, facial sensation intact, hearing intact, tongue/uvula/soft palate midline, normal sternocleidomastoid and trapezius muscle strength. No evidence of tongue atrophy or fibrillations Motor: 5/5 in both upper and lower extremities with no vertical drift.  Normal tone. Sensation- Intact to light touch bilaterally.  No extinction to double simultaneous stimulation Coordination: FTN intact bilaterally, no ataxia in BLE. Gait- deferred  NIHSS - 0   Labs I have reviewed labs in epic and the results pertinent to this consultation are:  CBC    Component Value Date/Time   WBC 5.0 05/14/2018 1735   RBC 4.83 05/14/2018 1735   HGB 13.8 05/14/2018  1735   HCT 44.0 05/14/2018 1735   PLT 264 05/14/2018 1735   MCV 91.1 05/14/2018 1735   MCH 28.6 05/14/2018 1735   MCHC 31.4 05/14/2018 1735   RDW 13.8 05/14/2018 1735   LYMPHSABS 1.3 05/14/2018 1735   MONOABS 0.4 05/14/2018 1735   EOSABS 0.0 05/14/2018 1735   BASOSABS 0.0 05/14/2018 1735    CMP     Component Value Date/Time   NA 139 05/14/2018 1735   NA 141 10/23/2017 1009   K 4.0 05/14/2018 1735   CL 107 05/14/2018 1735   CO2 26 05/14/2018 1735   GLUCOSE 99 05/14/2018 1735   BUN 17 05/14/2018 1735   BUN 11 10/23/2017 1009   CREATININE 1.16 05/14/2018 1735   CALCIUM 9.2 05/14/2018 1735   PROT 8.1 05/14/2018 1735   PROT 7.7 10/23/2017 1009   ALBUMIN 4.7 05/14/2018  1735   ALBUMIN 4.8 10/23/2017 1009   AST 26 05/14/2018 1735   ALT 27 05/14/2018 1735   ALKPHOS 57 05/14/2018 1735   BILITOT 1.5 (H) 05/14/2018 1735   BILITOT 1.5 (H) 10/23/2017 1009   GFRNONAA >60 05/14/2018 1735   GFRAA >60 05/14/2018 1735    Imaging I have reviewed the images obtained:  CT-scan of the brain-no acute changes CTA head and neck-normal anterior and posterior circulation with no stenosis or occlusion.  MRI examination of the brain-no evidence of acute stroke.  No acute abnormalities.  Assessment: 38 year old man with no significant cerebrovascular risk factors with the exception of rheumatic fever as a child with no sequelae, presenting to the emergency room for evaluation of off-and-on right-sided weakness that persisted from 10 AM to 7 PM on 05/14/2018 and has now completely resolved. He has been admitted by the medicine team for evaluation of strokelike symptoms. I am not sure if his symptoms with right arm and leg numbness and no frank weakness along with dizziness that he described as lightheadedness can be localized to the left cerebral hemisphere as a TIA. That said, given the history of rheumatic fever, I think he deserves further work-up in the form of an echocardiogram. As he has been  admitted for TIA work-up, I would not be opposed to checking his stroke risk factor labs lipid panel and hemoglobin A1c.  Impression: Dizziness/lightheadedness -cardiac etiology versus neurological etiology. Right arm and leg numbness Evaluate for TIA   Recommendations: Maintain on telemetry Aspirin 325 p.o. daily Atorvastatin 80 p.o. daily MRI and CT as of been completed.  No need to repeat imaging. 2D echocardiogram Frequent neurochecks Physical therapy Occupational Therapy Speech therapy Hemoglobin A1c Lipid panel Check urinary toxicology screen  Stroke neurology will follow with you.  -- Milon Dikes, MD [facility] Pager: [phone] If 7pm to 7am, please call on call as listed on AMION.

## 2018-05-14 NOTE — ED Notes (Signed)
Bed: WA13 Expected date:  Expected time:  Means of arrival:  Comments: Triage 1 

## 2018-05-14 NOTE — ED Triage Notes (Signed)
Pt endorses dizziness and R arm numbness and weakness at 1000 this morning.  LSN - 0630.  No facial droop or slurred speech.  He also endorses unsteady gait, leaning to the left.  He is A&Ox 4.  He also c/o tingling in his RLE.

## 2018-05-14 NOTE — H&P (Signed)
History and Physical   Andrew Foley ZOX:[id number] DOB: [birth year] DOA: 05/14/2018  Referring MD/NP/PA: Dr. Adela Lank  PCP: Lodema Pilot, PA-C   Outpatient Specialists: None  Patient coming from: Home  Chief Complaint: Dizziness and right upper extremity numbness  HPI: Andrew Foley is a 38 y.o. male with medical history significant of gout, previous perirectal abscess that was treated who presented to the ER with sudden onset of lightheadedness dizziness as well as numbness with tingling in his right upper and lower extremity.  Symptoms started around 10:00 in the morning.  When he woke up he was doing fine.  He had difficulty ambulating.  Patient came to the ER when he was unable to drive himself home.  He was evaluated and so far initial chest negative.  Concern for possible CVA.  Head CT without contrast so far is negative.  Patient is being admitted to rule out CVA.  His numbness and tingling have improved but the dizziness persists.  No recent upper respiratory infection.  No inner ear problem.  No prior history of vertigo..  ED Course: Vitals and labs all entirely within normal.  Head CT without contrast is negative PT/INR within normal.  CT angiogram of the head and neck also all within normal.  MRI is currently pending.  Patient has been given aspirin and is being admitted for evaluation.  Review of Systems: As per HPI otherwise 10 point review of systems negative.    Past Medical History:  Diagnosis Date  . Abscess   . Hemorrhoids   . Rectal pain   . Rheumatic fever    as a child    Past Surgical History:  Procedure Laterality Date  . ADENOIDECTOMY    . ANAL EXAMINATION UNDER ANESTHESIA  06/01/2017   Dr Sheliah Hatch.  Brisk anal bleeding - cautery & suture control  . INCISION AND DRAINAGE ABSCESS ANAL  05/25/2017   [place].  Dr Albesa Seen.  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 PILONIDAL CYST  EXCISION       reports that he has been smoking cigars. He has never used smokeless tobacco. He reports current alcohol use of about 20.0 standard drinks of alcohol per week. He reports that he does not use drugs.  Allergies  Allergen Reactions  . Codeine Hives and Rash    Family History  Problem Relation Age of Onset  . Hyperlipidemia Father   . Hyperlipidemia Brother      Prior to Admission medications   Medication Sig Start Date End Date Taking? Authorizing Provider  allopurinol (ZYLOPRIM) 100 MG tablet Take 1 tablet (100 mg total) by mouth daily. 10/23/17  Yes [name], MD  clindamycin (CLEOCIN T) 1 % external solution Apply 1 application topically 2 (two) times daily.  04/23/18  Yes [provider]  colchicine 0.6 MG tablet Take 1 tablet (0.6 mg total) by mouth daily as needed (with gout flare.). 10/01/17  Yes [name], MD  fluticasone [facility]) 50 MCG/ACT nasal spray Place 2 sprays into both nostrils daily. 07/12/17  Yes [name], MD  predniSONE (DELTASONE) 20 MG tablet Day 1-3: take 3 tablets daily. Day 4-6: take 2 tablets daily. Day 7-9: Take 1 tablet daily. Take medications with breakfast. 10/11/17   Wallis Bamberg, PA-C    Physical Exam: Vitals:   05/14/18 1648  BP: 133/85  Pulse: 73  Resp: 18  Temp: 98.3 F (36.8 C)  TempSrc:  Oral  SpO2: 100%  Weight: 83.9 kg  Height: 6' (1.829 m)      Constitutional: NAD, calm, comfortable Vitals:   05/14/18 1648  BP: 133/85  Pulse: 73  Resp: 18  Temp: 98.3 F (36.8 C)  TempSrc: Oral  SpO2: 100%  Weight: 83.9 kg  Height: 6' (1.829 m)   Eyes: PERRL, lids and conjunctivae normal ENMT: Mucous membranes are moist. Posterior pharynx clear of any exudate or lesions.Normal dentition.  Neck: normal, supple, no masses, no thyromegaly Respiratory: clear to auscultation bilaterally, no wheezing, no crackles. Normal respiratory effort. No accessory muscle use.  Cardiovascular: Regular rate and  rhythm, no murmurs / rubs / gallops. No extremity edema. 2+ pedal pulses. No carotid bruits.  Abdomen: no tenderness, no masses palpated. No hepatosplenomegaly. Bowel sounds positive.  Musculoskeletal: no clubbing / cyanosis. No joint deformity upper and lower extremities. Good ROM, no contractures. Normal muscle tone.  Skin: no rashes, lesions, ulcers. No induration Neurologic: CN 2-12 grossly intact. Sensation intact, DTR normal. Strength 5/5 in all 4.  Romberg sign is negative Psychiatric: Normal judgment and insight. Alert and oriented x 3. Normal mood.     Labs on Admission: I have personally reviewed following labs and imaging studies  CBC: Recent Labs  Lab 05/14/18 1735  WBC 5.0  NEUTROABS 3.2  HGB 13.8  HCT 44.0  MCV 91.1  PLT 264   Basic Metabolic Panel: Recent Labs  Lab 05/14/18 1735  NA 139  K 4.0  CL 107  CO2 26  GLUCOSE 99  BUN 17  CREATININE 1.16  CALCIUM 9.2   GFR: Estimated Creatinine Clearance: 94.8 mL/min (by C-G formula based on SCr of 1.16 mg/dL). Liver Function Tests: Recent Labs  Lab 05/14/18 1735  AST 26  ALT 27  ALKPHOS 57  BILITOT 1.5*  PROT 8.1  ALBUMIN 4.7   No results for input(s): LIPASE, AMYLASE in the last 168 hours. No results for input(s): AMMONIA in the last 168 hours. Coagulation Profile: Recent Labs  Lab 05/14/18 1735  INR 0.99   Cardiac Enzymes: No results for input(s): CKTOTAL, CKMB, CKMBINDEX, TROPONINI in the last 168 hours. BNP (last 3 results) No results for input(s): PROBNP in the last 8760 hours. HbA1C: No results for input(s): HGBA1C in the last 72 hours. CBG: Recent Labs  Lab 05/14/18 1714  GLUCAP 79   Lipid Profile: No results for input(s): CHOL, HDL, LDLCALC, TRIG, CHOLHDL, LDLDIRECT in the last 72 hours. Thyroid Function Tests: No results for input(s): TSH, T4TOTAL, FREET4, T3FREE, THYROIDAB in the last 72 hours. Anemia Panel: No results for input(s): VITAMINB12, FOLATE, FERRITIN, TIBC, IRON,  RETICCTPCT in the last 72 hours. Urine analysis: No results found for: COLORURINE, APPEARANCEUR, LABSPEC, PHURINE, GLUCOSEU, HGBUR, BILIRUBINUR, KETONESUR, PROTEINUR, UROBILINOGEN, NITRITE, LEUKOCYTESUR Sepsis Labs: @LABRCNTIP (procalcitonin:4,lacticidven:4) )No results found for this or any previous visit (from the past 240 hour(s)).   Radiological Exams on Admission: Ct Angio Head W Or Wo Contrast  Result Date: 05/14/2018 CLINICAL DATA:  Dizziness and right arm numbness beginning this morning. EXAM: CT ANGIOGRAPHY HEAD AND NECK TECHNIQUE: Multidetector CT imaging of the head and neck was performed using the standard protocol during bolus administration of intravenous contrast. Multiplanar CT image reconstructions and MIPs were obtained to evaluate the vascular anatomy. Carotid stenosis measurements (when applicable) are obtained utilizing NASCET criteria, using the distal internal carotid diameter as the denominator. CONTRAST:  50mL ISOVUE-370 IOPAMIDOL (ISOVUE-370) INJECTION 76% COMPARISON:  Head CT earlier same day FINDINGS: CTA NECK FINDINGS Aortic arch:  Normal Right carotid system: Normal Left carotid system: Normal Vertebral arteries: Normal Skeleton: Minimal cervical spondylosis. Other neck: No soft tissue mass or lymphadenopathy. Upper chest: Normal Review of the MIP images confirms the above findings CTA HEAD FINDINGS Anterior circulation: Both internal carotid arteries are widely patent through the siphon regions. Supraclinoid internal carotid arteries are widely patent and normal. Anterior and middle cerebral vessels are patent and normal. Posterior circulation: Both vertebral arteries widely patent through the foramen magnum to the basilar. No basilar stenosis. Posterior circulation branch vessels appear normal. Venous sinuses: Patent and normal. Anatomic variants: None Delayed phase: No abnormal enhancement. Review of the MIP images confirms the above findings IMPRESSION: Normal CT angiography  of the head and neck. Electronically Signed   By: Paulina FusiMark  Shogry M.D.   On: 05/14/2018 19:38   Ct Head Wo Contrast  Result Date: 05/14/2018 CLINICAL DATA:  Dizziness and right arm numbness starting this morning. EXAM: CT HEAD WITHOUT CONTRAST TECHNIQUE: Contiguous axial images were obtained from the base of the skull through the vertex without intravenous contrast. COMPARISON:  None. FINDINGS: Brain: No evidence of acute infarction, hemorrhage, hydrocephalus, extra-axial collection or mass lesion/mass effect. Vascular: No hyperdense vessel or unexpected calcification. Skull: Normal. Negative for fracture or focal lesion. Sinuses/Orbits: No acute finding. Other: None. IMPRESSION: No focal acute intracranial abnormality identified. Electronically Signed   By: Sherian ReinWei-Chen  Lin M.D.   On: 05/14/2018 18:09   Ct Angio Neck W And/or Wo Contrast  Result Date: 05/14/2018 CLINICAL DATA:  Dizziness and right arm numbness beginning this morning. EXAM: CT ANGIOGRAPHY HEAD AND NECK TECHNIQUE: Multidetector CT imaging of the head and neck was performed using the standard protocol during bolus administration of intravenous contrast. Multiplanar CT image reconstructions and MIPs were obtained to evaluate the vascular anatomy. Carotid stenosis measurements (when applicable) are obtained utilizing NASCET criteria, using the distal internal carotid diameter as the denominator. CONTRAST:  80mL ISOVUE-370 IOPAMIDOL (ISOVUE-370) INJECTION 76% COMPARISON:  Head CT earlier same day FINDINGS: CTA NECK FINDINGS Aortic arch: Normal Right carotid system: Normal Left carotid system: Normal Vertebral arteries: Normal Skeleton: Minimal cervical spondylosis. Other neck: No soft tissue mass or lymphadenopathy. Upper chest: Normal Review of the MIP images confirms the above findings CTA HEAD FINDINGS Anterior circulation: Both internal carotid arteries are widely patent through the siphon regions. Supraclinoid internal carotid arteries are widely  patent and normal. Anterior and middle cerebral vessels are patent and normal. Posterior circulation: Both vertebral arteries widely patent through the foramen magnum to the basilar. No basilar stenosis. Posterior circulation branch vessels appear normal. Venous sinuses: Patent and normal. Anatomic variants: None Delayed phase: No abnormal enhancement. Review of the MIP images confirms the above findings IMPRESSION: Normal CT angiography of the head and neck. Electronically Signed   By: Paulina FusiMark  Shogry M.D.   On: 05/14/2018 19:38    EKG: Independently reviewed.  It showed normal sinus rhythm with some nonspecific ST changes.  Assessment/Plan Principal Problem:   TIA (transient ischemic attack) Active Problems:   Gout     #1 TIA: Patient symptoms consistent with TIA.  Low risk factors.  Initiate work-up including echocardiogram and carotid Dopplers.  Neurology has already been consulted and recommended TIA work-up.  #2 gout: History of gout but currently stable.  Continue treatment as per home with colchicine   DVT prophylaxis: Lovenox Code Status: Full code Family Communication: Wife and brother in the room Disposition Plan: Home Consults called: Dr. Jerrell BelfastAurora of neurology consulted over the phone Admission status:  Observation  Severity of Illness: The appropriate patient status for this patient is OBSERVATION. Observation status is judged to be reasonable and necessary in order to provide the required intensity of service to ensure the patient's safety. The patient's presenting symptoms, physical exam findings, and initial radiographic and laboratory data in the context of their medical condition is felt to place them at decreased risk for further clinical deterioration. Furthermore, it is anticipated that the patient will be medically stable for discharge from the hospital within 2 midnights of admission. The following factors support the patient status of observation.   " The patient's  presenting symptoms include dizziness and numbness. " The physical exam findings include poor coordination. " The initial radiographic and laboratory data are normal head CT.     Lonia Blood MD [facility] Pager [phone]  If 7PM-7AM, please contact night-coverage [email or web address] Password [facility]  05/14/2018, 7:50 PM

## 2018-05-14 NOTE — ED Provider Notes (Signed)
[facility]-EMERGENCY DEPT Provider Note   CSN: [id number] Arrival date & time: 05/14/18  1635     History   Chief Complaint Chief Complaint  Patient presents with  . Numbness  . Extremity Weakness    HPI Andrew Foley is a 38 y.o. male who presents for acute onset light headedness and transient numbness/tingling in his right upper and lower extremity. He woke up this morning feeling normal, ate his usual breakfast and went to work. Around 10 am, he began feeling light headed with associated numbness/tingling in his right arm and leg. He had a difficult time ambulating due to symptoms. He did not have any improvement with eating lunch. He attempted to drive himself home, but was unable to due to dizziness. He called his wife who brought him here for evaluation. Denies headache, vertigo sensation, slurred speech, facial droop, vision changes, chest pain, palpitations, shortness of breath.   Past Medical History:  Diagnosis Date  . Abscess   . Hemorrhoids   . Rectal pain   . Rheumatic fever    as a child    Patient Active Problem List   Diagnosis Date Noted  . Rectal bleeding s/p EUA/ligation 06/01/2017 06/01/2017  . Postoperative hemorrhagic shock from OSH surgery 06/01/2017  . Perianal abscess 05/16/2017    Past Surgical History:  Procedure Laterality Date  . ADENOIDECTOMY    . ANAL EXAMINATION UNDER ANESTHESIA  06/01/2017   Dr Sheliah Hatch.  Brisk anal bleeding - cautery & suture control  . INCISION AND DRAINAGE ABSCESS ANAL  05/25/2017   [place].  Dr Albesa Seen.  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 PILONIDAL CYST EXCISION          Home Medications    Prior to Admission medications   Medication Sig Start Date End Date Taking? Authorizing Provider  allopurinol (ZYLOPRIM) 100 MG tablet Take 1 tablet (100 mg total) by mouth daily. 10/23/17  Yes [name], MD    clindamycin (CLEOCIN T) 1 % external solution Apply 1 application topically 2 (two) times daily.  04/23/18  Yes [provider]  colchicine 0.6 MG tablet Take 1 tablet (0.6 mg total) by mouth daily as needed (with gout flare.). 10/01/17  Yes [name], MD  fluticasone [facility]) 50 MCG/ACT nasal spray Place 2 sprays into both nostrils daily. 07/12/17  Yes [name], MD  predniSONE (DELTASONE) 20 MG tablet Day 1-3: take 3 tablets daily. Day 4-6: take 2 tablets daily. Day 7-9: Take 1 tablet daily. Take medications with breakfast. 10/11/17   Wallis Bamberg, PA-C    Family History Family History  Problem Relation Age of Onset  . Hyperlipidemia Father   . Hyperlipidemia Brother     Social History Social History   Tobacco Use  . Smoking status: Light Tobacco Smoker    Types: Cigars  . Smokeless tobacco: Never Used  . Tobacco comment: 2 cigars a week  Substance Use Topics  . Alcohol use: Yes    Alcohol/week: 20.0 standard drinks    Types: 20 Cans of beer per week  . Drug use: No     Allergies   Codeine   Review of Systems Review of Systems  All other systems reviewed and are negative.    Physical Exam Updated Vital Signs BP 133/85 (BP Location: Left Arm)   Pulse 73   Temp 98.3 F (36.8 C) (Oral)   Resp 18  Ht 6' (1.829 m)   Wt 83.9 kg   SpO2 100%   BMI 25.09 kg/m   Physical Exam Vitals signs reviewed.  Constitutional:      General: He is not in acute distress.    Appearance: Normal appearance.  HENT:     Head: Normocephalic and atraumatic.     Mouth/Throat:     Mouth: Mucous membranes are dry.  Eyes:     Extraocular Movements: Extraocular movements intact.     Conjunctiva/sclera: Conjunctivae normal.     Pupils: Pupils are equal, round, and reactive to light.  Neck:     Musculoskeletal: Normal range of motion and neck supple.  Cardiovascular:     Rate and Rhythm: Normal rate and regular rhythm.     Heart sounds: Normal heart sounds.   Pulmonary:     Effort: Pulmonary effort is normal.     Breath sounds: Normal breath sounds.  Abdominal:     General: Abdomen is flat. Bowel sounds are normal.     Palpations: Abdomen is soft.     Tenderness: There is no abdominal tenderness.  Musculoskeletal: Normal range of motion.        General: No swelling.  Skin:    General: Skin is warm and dry.  Neurological:     Mental Status: He is alert and oriented to person, place, and time.     Cranial Nerves: Cranial nerves are intact.     Sensory: Sensation is intact.     Motor: Motor function is intact.     Coordination: Coordination is intact.  Psychiatric:        Mood and Affect: Mood normal.        Behavior: Behavior normal.      ED Treatments / Results  Labs (all labs ordered are listed, but only abnormal results are displayed) Labs Reviewed  COMPREHENSIVE METABOLIC PANEL - Abnormal; Notable for the following components:      Result Value   Total Bilirubin 1.5 (*)    All other components within normal limits  PROTIME-INR  APTT  CBC  DIFFERENTIAL  CBG MONITORING, ED    EKG EKG Interpretation  Date/Time:  Tuesday May 14 2018 16:52:05 EST Ventricular Rate:  68 PR Interval:    QRS Duration: 93 QT Interval:  416 QTC Calculation: 443 R Axis:   72 Text Interpretation:  Sinus rhythm flipped t waves in lead III now resolved Otherwise no significant change Confirmed by Melene Plan [phone]) on 05/14/2018 5:33:04 PM   Radiology Ct Head Wo Contrast  Result Date: 05/14/2018 CLINICAL DATA:  Dizziness and right arm numbness starting this morning. EXAM: CT HEAD WITHOUT CONTRAST TECHNIQUE: Contiguous axial images were obtained from the base of the skull through the vertex without intravenous contrast. COMPARISON:  None. FINDINGS: Brain: No evidence of acute infarction, hemorrhage, hydrocephalus, extra-axial collection or mass lesion/mass effect. Vascular: No hyperdense vessel or unexpected calcification. Skull: Normal.  Negative for fracture or focal lesion. Sinuses/Orbits: No acute finding. Other: None. IMPRESSION: No focal acute intracranial abnormality identified. Electronically Signed   By: Sherian Rein M.D.   On: 05/14/2018 18:09    Procedures Procedures (including critical care time)  Medications Ordered in ED Medications  sodium chloride 0.9 % bolus 1,000 mL (has no administration in time range)  aspirin tablet 325 mg (has no administration in time range)     Initial Impression / Assessment and Plan / ED Course  I have reviewed the triage vital signs and the nursing notes.  Pertinent labs & imaging results that were available during my care of the patient were reviewed by me and considered in my medical decision making (see chart for details).  Clinical Course as of May 14 1850  Tue May 14, 2018  1829 Comprehensive metabolic panel [CB]    Clinical Course User Index [CB] [place], [place], DO   38 y/o gentleman presenting with transient lightheadedness and RUE numbness/tingling around 10 am this morning. His numbness/tingling has resolved, but still feels somewhat lightheaded. Labs and CT head are unremarkable. No significant risk factors for TIA. Spoke with Dr. Laurence SlateAroor with neurology who recommended admission for full TIA work-up.    Final Clinical Impressions(s) / ED Diagnoses   Final diagnoses:  Numbness and tingling of right arm and leg   Have consulted hospitalist who will admit for further TIA work-up.   ED Discharge Orders    None       Bridget HartshornBloomfield, Carley D, DO 05/14/18 1917    Melene PlanFloyd, Dan, DO 05/14/18 Margretta [id number]

## 2018-05-14 NOTE — ED Notes (Signed)
Patient transported to MRI 

## 2018-05-15 ENCOUNTER — Observation Stay (HOSPITAL_COMMUNITY): Payer: PRIVATE HEALTH INSURANCE

## 2018-05-15 ENCOUNTER — Other Ambulatory Visit: Payer: Self-pay

## 2018-05-15 DIAGNOSIS — G459 Transient cerebral ischemic attack, unspecified: Secondary | ICD-10-CM

## 2018-05-15 DIAGNOSIS — Z713 Dietary counseling and surveillance: Secondary | ICD-10-CM | POA: Diagnosis not present

## 2018-05-15 DIAGNOSIS — Z8619 Personal history of other infectious and parasitic diseases: Secondary | ICD-10-CM | POA: Diagnosis not present

## 2018-05-15 DIAGNOSIS — Z885 Allergy status to narcotic agent status: Secondary | ICD-10-CM | POA: Diagnosis not present

## 2018-05-15 DIAGNOSIS — R42 Dizziness and giddiness: Secondary | ICD-10-CM | POA: Diagnosis not present

## 2018-05-15 DIAGNOSIS — Z8349 Family history of other endocrine, nutritional and metabolic diseases: Secondary | ICD-10-CM | POA: Diagnosis not present

## 2018-05-15 DIAGNOSIS — R29700 NIHSS score 0: Secondary | ICD-10-CM | POA: Diagnosis present

## 2018-05-15 DIAGNOSIS — Z79899 Other long term (current) drug therapy: Secondary | ICD-10-CM | POA: Diagnosis not present

## 2018-05-15 DIAGNOSIS — Z7951 Long term (current) use of inhaled steroids: Secondary | ICD-10-CM | POA: Diagnosis not present

## 2018-05-15 DIAGNOSIS — R200 Anesthesia of skin: Secondary | ICD-10-CM | POA: Diagnosis present

## 2018-05-15 DIAGNOSIS — F17290 Nicotine dependence, other tobacco product, uncomplicated: Secondary | ICD-10-CM | POA: Diagnosis present

## 2018-05-15 DIAGNOSIS — M109 Gout, unspecified: Secondary | ICD-10-CM | POA: Diagnosis present

## 2018-05-15 LAB — HEMOGLOBIN A1C
Hgb A1c MFr Bld: 5.7 % — ABNORMAL HIGH (ref 4.8–5.6)
Mean Plasma Glucose: 116.89 mg/dL

## 2018-05-15 LAB — LIPID PANEL
CHOLESTEROL: 182 mg/dL (ref 0–200)
HDL: 56 mg/dL (ref >40)
LDL Cholesterol: 119 mg/dL — ABNORMAL HIGH (ref 0–99)
Total CHOL/HDL Ratio: 3.3 RATIO
Triglycerides: 36 mg/dL (ref <150)
VLDL: 7 mg/dL (ref 0–40)

## 2018-05-15 MED ORDER — ASPIRIN EC 81 MG PO TBEC: 81.0000 mg | DELAYED_RELEASE_TABLET | Freq: Every day | ORAL | 11 refills | Status: AC

## 2018-05-15 MED ORDER — ATORVASTATIN CALCIUM 10 MG PO TABS: 10.0000 mg | ORAL_TABLET | Freq: Every day | ORAL | 3 refills | Status: DC

## 2018-05-15 NOTE — ED Notes (Signed)
Pt changed to hospital bed waiting transfer to [facility].

## 2018-05-15 NOTE — Progress Notes (Addendum)
Bilateral carotid duplex completed. No evidence of ICA stenosis. Preliminary results found in chart review CV Proc.  [employer], RVS 05/15/2018, 8:49 AM

## 2018-05-15 NOTE — Progress Notes (Signed)
Physician Discharge Summary  Andrew Foley QHU:[id number] DOB: [birth year] DOA: 05/14/2018  PCP: Lodema Pilot, PA-C  Admit date: 05/14/2018 Discharge date: 05/16/2018  Time spent: 40 minutes  Recommendations for Outpatient Follow-up:  1. Initiated statin aspirin 81 this admission 2. 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 History of reported rheumatic fever as a child Admitted secondary to episodic dizziness right leg and right arm numbness Neurology consulted given strokelike symptoms  Patient was kept overnight and work-up was performed showing no specific findings on MRI or CT scan of head Echocardiogram showed some vague suggestion of diastolic dysfunction I extensively counseled him about diet weight loss-he is already lost a lot of weight since his recent fistula resection and he is on the path to doing more exercise and eating right  Because of positive for symptoms it was unlikely that he had a stroke or TIA but felt reasonable to start him on aspirin 81 low-dose Lipitor anyway He should follow-up with PCP for repeat labs as A1c was slightly elevated at 5.7   Discharge Exam: Vitals:   05/15/18 1946 05/16/18 0544  BP: 116/85 108/69  Pulse: (!) 59 (!) 52  Resp: 19 16  Temp: 98.6 F (37 C) 97.8 F (36.6 C)  SpO2: 100% 100%    General: Awake alert pleasant no distress ambulatory no deficit Cardiovascular: S1-S2 no murmur rub or gallop Respiratory: Clinically clear no added sound S1-S2 no murmur rub or gallop Abdomen soft nontender no rebound Neurologically intact moving all 4 limbs equally  ambulating in the hallway  Discharge Instructions   Discharge Instructions    Diet - low sodium heart healthy   Complete by:  As directed    Discharge instructions   Complete by:  As directed    You might have had a mini-stroke but it is quite unlikely--because of our unsurety I would recommend you take aspirin, the cholesterol medicine You have pre-diabetes   Discharge patient   Complete by:  As directed    Discharge disposition:  01-Home or Self Care   Discharge patient date:  05/16/2018   Increase activity slowly   Complete by:  As directed      Allergies as of 05/16/2018      Reactions   Codeine Hives, Rash      Medication List    STOP taking these medications   predniSONE 20 MG tablet Commonly known as:  DELTASONE     TAKE these medications   allopurinol 100 MG tablet Commonly known as:  ZYLOPRIM Take 1 tablet (100 mg total) by mouth daily.   aspirin EC 81 MG tablet Take 1 tablet (81 mg total) by mouth daily.   atorvastatin 10 MG tablet Commonly known as:  LIPITOR Take 1 tablet (10 mg total) by mouth daily at 6 PM.   clindamycin 1 % external solution Commonly known as:  CLEOCIN T Apply 1 application topically 2 (two) times daily.   colchicine 0.6 MG tablet Take 1 tablet (0.6 mg total) by mouth daily as needed (with gout flare.).   fluticasone 50 MCG/ACT nasal spray Commonly known as:  FLONASE Place 2 sprays into both nostrils daily.  Allergies  Allergen Reactions  . Codeine Hives and Rash      The results of significant diagnostics from this hospitalization (including imaging, microbiology, ancillary and laboratory) are listed below for reference.    Significant Diagnostic Studies: Ct Angio Head W Or Wo Contrast  Result Date: 05/14/2018 CLINICAL DATA:  Dizziness and right arm numbness beginning this morning. EXAM: CT ANGIOGRAPHY HEAD AND NECK TECHNIQUE: Multidetector CT imaging of the head and neck was performed using the standard protocol  during bolus administration of intravenous contrast. Multiplanar CT image reconstructions and MIPs were obtained to evaluate the vascular anatomy. Carotid stenosis measurements (when applicable) are obtained utilizing NASCET criteria, using the distal internal carotid diameter as the denominator. CONTRAST:  80mL ISOVUE-370 IOPAMIDOL (ISOVUE-370) INJECTION 76% COMPARISON:  Head CT earlier same day FINDINGS: CTA NECK FINDINGS Aortic arch: Normal Right carotid system: Normal Left carotid system: Normal Vertebral arteries: Normal Skeleton: Minimal cervical spondylosis. Other neck: No soft tissue mass or lymphadenopathy. Upper chest: Normal Review of the MIP images confirms the above findings CTA HEAD FINDINGS Anterior circulation: Both internal carotid arteries are widely patent through the siphon regions. Supraclinoid internal carotid arteries are widely patent and normal. Anterior and middle cerebral vessels are patent and normal. Posterior circulation: Both vertebral arteries widely patent through the foramen magnum to the basilar. No basilar stenosis. Posterior circulation branch vessels appear normal. Venous sinuses: Patent and normal. Anatomic variants: None Delayed phase: No abnormal enhancement. Review of the MIP images confirms the above findings IMPRESSION: Normal CT angiography of the head and neck. Electronically Signed   By: Paulina Fusi M.D.   On: 05/14/2018 19:38   Ct Head Wo Contrast  Result Date: 05/14/2018 CLINICAL DATA:  Dizziness and right arm numbness starting this morning. EXAM: CT HEAD WITHOUT CONTRAST TECHNIQUE: Contiguous axial images were obtained from the base of the skull through the vertex without intravenous contrast. COMPARISON:  None. FINDINGS: Brain: No evidence of acute infarction, hemorrhage, hydrocephalus, extra-axial collection or mass lesion/mass effect. Vascular: No hyperdense vessel or unexpected calcification. Skull: Normal. Negative for fracture or focal lesion.  Sinuses/Orbits: No acute finding. Other: None. IMPRESSION: No focal acute intracranial abnormality identified. Electronically Signed   By: Sherian Rein M.D.   On: 05/14/2018 18:09   Ct Angio Neck W And/or Wo Contrast  Result Date: 05/14/2018 CLINICAL DATA:  Dizziness and right arm numbness beginning this morning. EXAM: CT ANGIOGRAPHY HEAD AND NECK TECHNIQUE: Multidetector CT imaging of the head and neck was performed using the standard protocol during bolus administration of intravenous contrast. Multiplanar CT image reconstructions and MIPs were obtained to evaluate the vascular anatomy. Carotid stenosis measurements (when applicable) are obtained utilizing NASCET criteria, using the distal internal carotid diameter as the denominator. CONTRAST:  80mL ISOVUE-370 IOPAMIDOL (ISOVUE-370) INJECTION 76% COMPARISON:  Head CT earlier same day FINDINGS: CTA NECK FINDINGS Aortic arch: Normal Right carotid system: Normal Left carotid system: Normal Vertebral arteries: Normal Skeleton: Minimal cervical spondylosis. Other neck: No soft tissue mass or lymphadenopathy. Upper chest: Normal Review of the MIP images confirms the above findings CTA HEAD FINDINGS Anterior circulation: Both internal carotid arteries are widely patent through the siphon regions. Supraclinoid internal carotid arteries are widely patent and normal. Anterior and middle cerebral vessels are patent and normal. Posterior circulation: Both vertebral arteries widely patent through the foramen magnum to the basilar. No basilar stenosis. Posterior circulation branch vessels appear normal. Venous sinuses: Patent and normal. Anatomic variants: None Delayed phase: No abnormal enhancement. Review of the MIP  images confirms the above findings IMPRESSION: Normal CT angiography of the head and neck. Electronically Signed   By: Paulina FusiMark  Shogry M.D.   On: 05/14/2018 19:38   Mr Brain Wo Contrast  Result Date: 05/14/2018 CLINICAL DATA:  Initial evaluation for acute  dizziness with right arm numbness and weakness. EXAM: MRI HEAD WITHOUT CONTRAST TECHNIQUE: Multiplanar, multiecho pulse sequences of the brain and surrounding structures were obtained without intravenous contrast. COMPARISON:  Prior CTs from earlier the same day. FINDINGS: Brain: Cerebral volume within normal limits for patient age. No focal parenchymal signal abnormality identified. No abnormal foci of restricted diffusion to suggest acute or subacute ischemia. Gray-white matter differentiation well maintained. No encephalomalacia to suggest chronic infarction. No foci of susceptibility artifact to suggest acute or chronic intracranial hemorrhage. Mass lesion, midline shift or mass effect. No hydrocephalus. No extra-axial fluid collection. Major dural sinuses are grossly patent. Pituitary gland and suprasellar region are normal. Midline structures intact and normal. Vascular: Major intracranial vascular flow voids well maintained and normal in appearance. 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 Visualized upper cervical spine within normal limits. Bone marrow signal intensity normal. No scalp soft tissue abnormality. Sinuses/Orbits: Globes and orbital soft tissues within normal limits. Paranasal sinuses are clear. Trace bilateral mastoid effusions noted, of doubtful significance. Inner ear structures normal. Other: None. IMPRESSION: 1. No acute intracranial abnormality. 2. 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 3. Otherwise normal brain MRI. Electronically Signed   By: Rise MuBenjamin  McClintock M.D.   On: 05/14/2018 20:12   [facility] [place]  Result Date: 05/15/2018 Carotid Arterial Duplex Study Indications: TIA and Right sided Numbness which has resolved Performing Technologist: Toma DeitersVirginia Slaughter RVS  Examination Guidelines: A complete evaluation includes B-mode imaging, spectral Doppler, color  Doppler, and power Doppler as needed of all accessible portions of each vessel. Bilateral testing is considered an integral part of a complete examination. Limited examinations for reoccurring indications may be performed as noted.  Right Carotid Findings: +----------+--------+--------+--------+--------+----------------------------+           PSV cm/sEDV cm/sStenosisDescribeComments                     +----------+--------+--------+--------+--------+----------------------------+ CCA Prox  85      14                      minimal intimal wall changes +----------+--------+--------+--------+--------+----------------------------+ CCA Distal90      13                      minimal intimal wall changes +----------+--------+--------+--------+--------+----------------------------+ ICA Prox  67      15                      mild intimal wall changes    +----------+--------+--------+--------+--------+----------------------------+ ICA Mid   71      18                                                   +----------+--------+--------+--------+--------+----------------------------+ ICA Distal68      25                      tortuous                     +----------+--------+--------+--------+--------+----------------------------+  ECA       85      11                      mild intimal wall changes    +----------+--------+--------+--------+--------+----------------------------+ +----------+--------+-------+--------+-------------------+           PSV cm/sEDV cmsDescribeArm Pressure (mmHG) +----------+--------+-------+--------+-------------------+ [id number]                                        +----------+--------+-------+--------+-------------------+ +---------+--------+--+--------+-+ VertebralPSV cm/s42EDV cm/s7 +---------+--------+--+--------+-+  Left Carotid Findings: +----------+--------+--------+--------+--------+-------------------------+           PSV  cm/sEDV cm/sStenosisDescribeComments                  +----------+--------+--------+--------+--------+-------------------------+ CCA Prox  98      18                      mild intimal wall changes +----------+--------+--------+--------+--------+-------------------------+ CCA Distal98      18                      mild intimal wall changes +----------+--------+--------+--------+--------+-------------------------+ ICA Prox  68      10                      mild intimal wall changes +----------+--------+--------+--------+--------+-------------------------+ ICA Mid   70      24                                                +----------+--------+--------+--------+--------+-------------------------+ ICA Distal75      21                      tortuous                  +----------+--------+--------+--------+--------+-------------------------+ ECA       87      8                       mild intimal wall changes +----------+--------+--------+--------+--------+-------------------------+ +----------+--------+--------+--------+-------------------+ SubclavianPSV cm/sEDV cm/sDescribeArm Pressure (mmHG) +----------+--------+--------+--------+-------------------+           102                                         +----------+--------+--------+--------+-------------------+ +---------+--------+--+--------+--+ VertebralPSV cm/s51EDV cm/s12 +---------+--------+--+--------+--+  Summary: Right Carotid: There is no evidence of stenosis in the right ICA. Left Carotid: There is no evidence of stenosis in the left ICA. Vertebrals:  Bilateral vertebral arteries demonstrate antegrade flow. Subclavians: Normal flow hemodynamics were seen in bilateral subclavian              arteries. *See table(s) above for measurements and observations.     Preliminary     Microbiology: No results found for this or any previous visit (from the past 240 hour(s)).   Labs: Basic Metabolic  Panel: Recent Labs  Lab 05/14/18 1735 05/16/18 0317  NA 139 139  K 4.0 3.6  CL 107 105  CO2 26 25  GLUCOSE 99 92  BUN 17 21*  CREATININE 1.16 1.06  CALCIUM 9.2 9.2   Liver Function  Tests: Recent Labs  Lab 05/14/18 1735  AST 26  ALT 27  ALKPHOS 57  BILITOT 1.5*  PROT 8.1  ALBUMIN 4.7   No results for input(s): LIPASE, AMYLASE in the last 168 hours. No results for input(s): AMMONIA in the last 168 hours. CBC: Recent Labs  Lab 05/14/18 1735 05/16/18 0317  WBC 5.0 5.2  NEUTROABS 3.2 1.9  HGB 13.8 13.6  HCT 44.0 43.7  MCV 91.1 93.4  PLT 264 246   Cardiac Enzymes: No results for input(s): CKTOTAL, CKMB, CKMBINDEX, TROPONINI in the last 168 hours. BNP: BNP (last 3 results) No results for input(s): BNP in the last 8760 hours.  ProBNP (last 3 results) No results for input(s): PROBNP in the last 8760 hours.  CBG: Recent Labs  Lab 05/14/18 1714  GLUCAP 79       Signed:  Rhetta Mura MD   [facility] 05/16/2018, 1:03 PM

## 2018-05-15 NOTE — ED Notes (Signed)
ECHO tech at bedside.

## 2018-05-15 NOTE — ED Notes (Addendum)
Vascular tech at bedside.  Dr Mahala Menghini informed this RN and patient that patient can eat and drink. Pt bed order will be changed from [facility] to [facility].  Secretary made aware about food tray to be ordered.

## 2018-05-15 NOTE — Progress Notes (Signed)
Per Dr. Landis Gandy, patient able to be discharged once echocardiogram results and is viewed. Results still not showing at end of shift. Passed info on to oncoming nurse.

## 2018-05-15 NOTE — ED Notes (Signed)
Pt asking what Plavix and ASA are for. Explained to patient that they help platelets from sticking together in your blood vessels. Pt asking to speak to MD before he takes the Plavix.  Pt also asking to speak with doctor about getting clarification on how long will have to be in hospital, as he was told by one doctor that he would probably be going home today. Admission order states 3-4 days.  Messaged Dr Mahala Menghini and made aware of patient's request to speak with him.

## 2018-05-15 NOTE — Progress Notes (Signed)
Received report from [place], [place] in ED

## 2018-05-15 NOTE — Progress Notes (Signed)
OT Cancellation Note  Patient Details Name: ELIIJAH LAFAYETTE MRN: [id number] DOB: Nov 30, 1980   Cancelled Treatment:    Reason Eval/Treat Not Completed: PT screened, no needs identified, will sign off.   Jemarion Roycroft 05/15/2018, 3:49 PM  Marica Otter, OTR/L Acute Rehabilitation Services [phone] [facility] pager [phone] office 05/15/2018

## 2018-05-15 NOTE — Evaluation (Signed)
Physical Therapy Evaluation Patient Details Name: Andrew Foley MRN: [id number] DOB: 11-Aug-1980 Today's Date: 05/15/2018   History of Present Illness  38 y.o. male with medical history significant of gout, previous perirectal abscess that was treated who presented to the ER with sudden onset of lightheadedness dizziness as well as numbness with tingling in his right upper and lower extremity.   Clinical Impression  Pt ambulated 400' without an assistive device, no loss of balance. No deficits noted with strength, balance, sensation, mobility. Pt feels he is at baseline. He denied dizziness throughout PT session. No further PT indicated, will sign off.      Follow Up Recommendations No PT follow up    Equipment Recommendations  None recommended by PT    Recommendations for Other Services       Precautions / Restrictions Precautions Precautions: None Precaution Comments: denies falls in past 1 year Restrictions Weight Bearing Restrictions: No      Mobility  Bed Mobility Overal bed mobility: Independent                Transfers Overall transfer level: Independent Equipment used: None                Ambulation/Gait Ambulation/Gait assistance: Independent Gait Distance (Feet): 400 Feet Assistive device: None Gait Pattern/deviations: WFL(Within Functional Limits) Gait velocity: WNL   General Gait Details: steady, no loss of balance with head tilts/turns  Stairs            Wheelchair Mobility    Modified Rankin (Stroke Patients Only)       Balance Overall balance assessment: Independent                                           Pertinent Vitals/Pain Pain Assessment: No/denies pain    Home Living Family/patient expects to be discharged to:: Private residence Living Arrangements: Spouse/significant other;Children(pt has 3 children) Available Help at Discharge: Family;Available PRN/intermittently   Home Access: Stairs to  enter   Entrance Stairs-Number of Steps: 1 Home Layout: One level Home Equipment: None      Prior Function Level of Independence: Independent         Comments: works as IT consultant        Extremity/Trunk Assessment   Upper Extremity Assessment Upper Extremity Assessment: Overall WFL for tasks assessed    Lower Extremity Assessment Lower Extremity Assessment: Overall WFL for tasks assessed    Cervical / Trunk Assessment Cervical / Trunk Assessment: Normal  Communication   Communication: No difficulties  Cognition Arousal/Alertness: Awake/alert Behavior During Therapy: WFL for tasks assessed/performed Overall Cognitive Status: Within Functional Limits for tasks assessed                                        General Comments      Exercises     Assessment/Plan    PT Assessment Patent does not need any further PT services  PT Problem List         PT Treatment Interventions      PT Goals (Current goals can be found in the Care Plan section)  Acute Rehab PT Goals Patient Stated Goal: return to work, play basketball PT Goal Formulation: All assessment and education complete, DC therapy  Frequency     Barriers to discharge        Co-evaluation               AM-PAC PT "6 Clicks" Mobility  Outcome Measure Help needed turning from your back to your side while in a flat bed without using bedrails?: None Help needed moving from lying on your back to sitting on the side of a flat bed without using bedrails?: None Help needed moving to and from a bed to a chair (including a wheelchair)?: None Help needed standing up from a chair using your arms (e.g., wheelchair or bedside chair)?: None Help needed to walk in hospital room?: None Help needed climbing 3-5 steps with a railing? : None 6 Click Score: 24    End of Session Equipment Utilized During Treatment: Gait belt Activity Tolerance: Patient tolerated  treatment well Patient left: in bed;with call bell/phone within reach;with family/visitor present Nurse Communication: Mobility status      Time: 1331-1340 PT Time Calculation (min) (ACUTE ONLY): 9 min   Charges:   PT Evaluation $PT Eval Low Complexity: 1 Low          Tamala SerUhlenberg, Edson Deridder Kistler PT 05/15/2018  Acute Rehabilitation Services Pager [phone] Office [phone]

## 2018-05-16 LAB — CBC WITH DIFFERENTIAL/PLATELET
Abs Immature Granulocytes: 0.01 K/uL (ref 0.00–0.07)
Basophils Absolute: 0 K/uL (ref 0.0–0.1)
Basophils Relative: 1 %
Eosinophils Absolute: 0.1 K/uL (ref 0.0–0.5)
Eosinophils Relative: 3 %
HCT: 43.7 % (ref 39.0–52.0)
Hemoglobin: 13.6 g/dL (ref 13.0–17.0)
Immature Granulocytes: 0 %
Lymphocytes Relative: 51 %
Lymphs Abs: 2.7 K/uL (ref 0.7–4.0)
MCH: 29.1 pg (ref 26.0–34.0)
MCHC: 31.1 g/dL (ref 30.0–36.0)
MCV: 93.4 fL (ref 80.0–100.0)
Monocytes Absolute: 0.4 K/uL (ref 0.1–1.0)
Monocytes Relative: 8 %
Neutro Abs: 1.9 K/uL (ref 1.7–7.7)
Neutrophils Relative %: 37 %
Platelets: 246 K/uL (ref 150–400)
RBC: 4.68 MIL/uL (ref 4.22–5.81)
RDW: 13.7 % (ref 11.5–15.5)
WBC: 5.2 K/uL (ref 4.0–10.5)
nRBC: 0 % (ref 0.0–0.2)

## 2018-05-16 LAB — BASIC METABOLIC PANEL WITH GFR
Anion gap: 9 (ref 5–15)
BUN: 21 mg/dL — ABNORMAL HIGH (ref 6–20)
CO2: 25 mmol/L (ref 22–32)
Calcium: 9.2 mg/dL (ref 8.9–10.3)
Chloride: 105 mmol/L (ref 98–111)
Creatinine, Ser: 1.06 mg/dL (ref 0.61–1.24)
GFR calc Af Amer: >60 mL/min (ref >60)
GFR calc non Af Amer: >60 mL/min (ref >60)
Glucose, Bld: 92 mg/dL (ref 70–99)
Potassium: 3.6 mmol/L (ref 3.5–5.1)
Sodium: 139 mmol/L (ref 135–145)

## 2018-05-16 LAB — ECHOCARDIOGRAM COMPLETE
HEIGHTINCHES: 72 in
Weight: 2960 oz

## 2018-05-16 NOTE — Progress Notes (Signed)
Reviewed discharge summary with patient and reviewed new medications & regimen. Patient walked out in care of his wife.

## 2018-05-16 NOTE — Discharge Summary (Signed)
Physician Discharge Summary  Andrew RobinsonClifton E Fujikawa WGN:[id number] DOB: 02/12/1981 DOA: 05/14/2018  PCP: Lodema Foley, Kristy, PA-C  Admit date: 05/14/2018 Discharge date: 05/16/2018  Time spent: 40 minutes  Recommendations for Outpatient Follow-up:  1. Initiated statin aspirin 81 this admission 2. 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 History of reported rheumatic fever as a child Admitted secondary to episodic dizziness right leg and right arm numbness Neurology consulted given strokelike symptoms  Patient was kept overnight and work-up was performed showing no specific findings on MRI or CT scan of head Echocardiogram showed some vague suggestion of diastolic dysfunction I extensively counseled him about diet weight loss-he is already lost a lot of weight since his recent fistula resection and he is on the path to doing more exercise and eating right  Because of positive for symptoms it was unlikely that he had a stroke or TIA but felt reasonable to start him on aspirin 81 low-dose Lipitor anyway He should follow-up with PCP for repeat labs as A1c was slightly elevated at 5.7   Discharge Exam: Vitals:   05/15/18 1946 05/16/18 0544  BP: 116/85 108/69  Pulse: (!) 59 (!) 52  Resp: 19 16  Temp: 98.6 F (37 C) 97.8 F (36.6 C)  SpO2: 100% 100%    General: Awake alert pleasant no distress ambulatory no deficit Cardiovascular: S1-S2 no murmur rub or gallop Respiratory: Clinically clear no added sound S1-S2 no murmur rub or gallop Abdomen soft nontender no rebound Neurologically intact moving all 4 limbs equally  ambulating in the hallway  Discharge Instructions   Discharge Instructions    Diet - low sodium heart healthy   Complete by:  As directed    Discharge instructions   Complete by:  As directed    You might have had a mini-stroke but it is quite unlikely--because of our unsurety I would recommend you take aspirin, the cholesterol medicine You have pre-diabetes   Discharge patient   Complete by:  As directed    Discharge disposition:  01-Home or Self Care   Discharge patient date:  05/16/2018   Increase activity slowly   Complete by:  As directed      Allergies as of 05/16/2018      Reactions   Codeine Hives, Rash      Medication List    STOP taking these medications   predniSONE 20 MG tablet Commonly known as:  DELTASONE     TAKE these medications   allopurinol 100 MG tablet Commonly known as:  ZYLOPRIM Take 1 tablet (100 mg total) by mouth daily.   aspirin EC 81 MG tablet Take 1 tablet (81 mg total) by mouth daily.   atorvastatin 10 MG tablet Commonly known as:  LIPITOR Take 1 tablet (10 mg total) by mouth daily at 6 PM.   clindamycin 1 % external solution Commonly known as:  CLEOCIN T Apply 1 application topically 2 (two) times daily.   colchicine 0.6 MG tablet Take 1 tablet (0.6 mg total) by mouth daily as needed (with gout flare.).   fluticasone 50 MCG/ACT nasal spray Commonly known as:  FLONASE Place 2 sprays into both nostrils daily.  Allergies  Allergen Reactions  . Codeine Hives and Rash      The results of significant diagnostics from this hospitalization (including imaging, microbiology, ancillary and laboratory) are listed below for reference.    Significant Diagnostic Studies: Ct Angio Head W Or Wo Contrast  Result Date: 05/14/2018 CLINICAL DATA:  Dizziness and right arm numbness beginning this morning. EXAM: CT ANGIOGRAPHY HEAD AND NECK TECHNIQUE: Multidetector CT imaging of the head and neck was performed using the standard protocol  during bolus administration of intravenous contrast. Multiplanar CT image reconstructions and MIPs were obtained to evaluate the vascular anatomy. Carotid stenosis measurements (when applicable) are obtained utilizing NASCET criteria, using the distal internal carotid diameter as the denominator. CONTRAST:  80mL ISOVUE-370 IOPAMIDOL (ISOVUE-370) INJECTION 76% COMPARISON:  Head CT earlier same day FINDINGS: CTA NECK FINDINGS Aortic arch: Normal Right carotid system: Normal Left carotid system: Normal Vertebral arteries: Normal Skeleton: Minimal cervical spondylosis. Other neck: No soft tissue mass or lymphadenopathy. Upper chest: Normal Review of the MIP images confirms the above findings CTA HEAD FINDINGS Anterior circulation: Both internal carotid arteries are widely patent through the siphon regions. Supraclinoid internal carotid arteries are widely patent and normal. Anterior and middle cerebral vessels are patent and normal. Posterior circulation: Both vertebral arteries widely patent through the foramen magnum to the basilar. No basilar stenosis. Posterior circulation branch vessels appear normal. Venous sinuses: Patent and normal. Anatomic variants: None Delayed phase: No abnormal enhancement. Review of the MIP images confirms the above findings IMPRESSION: Normal CT angiography of the head and neck. Electronically Signed   By: Paulina Fusi M.D.   On: 05/14/2018 19:38   Ct Head Wo Contrast  Result Date: 05/14/2018 CLINICAL DATA:  Dizziness and right arm numbness starting this morning. EXAM: CT HEAD WITHOUT CONTRAST TECHNIQUE: Contiguous axial images were obtained from the base of the skull through the vertex without intravenous contrast. COMPARISON:  None. FINDINGS: Brain: No evidence of acute infarction, hemorrhage, hydrocephalus, extra-axial collection or mass lesion/mass effect. Vascular: No hyperdense vessel or unexpected calcification. Skull: Normal. Negative for fracture or focal lesion.  Sinuses/Orbits: No acute finding. Other: None. IMPRESSION: No focal acute intracranial abnormality identified. Electronically Signed   By: Sherian Rein M.D.   On: 05/14/2018 18:09   Ct Angio Neck W And/or Wo Contrast  Result Date: 05/14/2018 CLINICAL DATA:  Dizziness and right arm numbness beginning this morning. EXAM: CT ANGIOGRAPHY HEAD AND NECK TECHNIQUE: Multidetector CT imaging of the head and neck was performed using the standard protocol during bolus administration of intravenous contrast. Multiplanar CT image reconstructions and MIPs were obtained to evaluate the vascular anatomy. Carotid stenosis measurements (when applicable) are obtained utilizing NASCET criteria, using the distal internal carotid diameter as the denominator. CONTRAST:  80mL ISOVUE-370 IOPAMIDOL (ISOVUE-370) INJECTION 76% COMPARISON:  Head CT earlier same day FINDINGS: CTA NECK FINDINGS Aortic arch: Normal Right carotid system: Normal Left carotid system: Normal Vertebral arteries: Normal Skeleton: Minimal cervical spondylosis. Other neck: No soft tissue mass or lymphadenopathy. Upper chest: Normal Review of the MIP images confirms the above findings CTA HEAD FINDINGS Anterior circulation: Both internal carotid arteries are widely patent through the siphon regions. Supraclinoid internal carotid arteries are widely patent and normal. Anterior and middle cerebral vessels are patent and normal. Posterior circulation: Both vertebral arteries widely patent through the foramen magnum to the basilar. No basilar stenosis. Posterior circulation branch vessels appear normal. Venous sinuses: Patent and normal. Anatomic variants: None Delayed phase: No abnormal enhancement. Review of the MIP  images confirms the above findings IMPRESSION: Normal CT angiography of the head and neck. Electronically Signed   By: Paulina FusiMark  Shogry M.D.   On: 05/14/2018 19:38   Mr Brain Wo Contrast  Result Date: 05/14/2018 CLINICAL DATA:  Initial evaluation for acute  dizziness with right arm numbness and weakness. EXAM: MRI HEAD WITHOUT CONTRAST TECHNIQUE: Multiplanar, multiecho pulse sequences of the brain and surrounding structures were obtained without intravenous contrast. COMPARISON:  Prior CTs from earlier the same day. FINDINGS: Brain: Cerebral volume within normal limits for patient age. No focal parenchymal signal abnormality identified. No abnormal foci of restricted diffusion to suggest acute or subacute ischemia. Gray-white matter differentiation well maintained. No encephalomalacia to suggest chronic infarction. No foci of susceptibility artifact to suggest acute or chronic intracranial hemorrhage. Mass lesion, midline shift or mass effect. No hydrocephalus. No extra-axial fluid collection. Major dural sinuses are grossly patent. Pituitary gland and suprasellar region are normal. Midline structures intact and normal. Vascular: Major intracranial vascular flow voids well maintained and normal in appearance. 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 Visualized upper cervical spine within normal limits. Bone marrow signal intensity normal. No scalp soft tissue abnormality. Sinuses/Orbits: Globes and orbital soft tissues within normal limits. Paranasal sinuses are clear. Trace bilateral mastoid effusions noted, of doubtful significance. Inner ear structures normal. Other: None. IMPRESSION: 1. No acute intracranial abnormality. 2. 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 3. Otherwise normal brain MRI. Electronically Signed   By: Rise MuBenjamin  McClintock M.D.   On: 05/14/2018 20:12   [facility] [place] [facility])  Result Date: 05/15/2018 Carotid Arterial Duplex Study Indications: TIA and Right sided Numbness which has resolved Performing Technologist: Toma DeitersVirginia Slaughter RVS  Examination Guidelines: A complete evaluation includes B-mode imaging, spectral Doppler, color  Doppler, and power Doppler as needed of all accessible portions of each vessel. Bilateral testing is considered an integral part of a complete examination. Limited examinations for reoccurring indications may be performed as noted.  Right Carotid Findings: +----------+--------+--------+--------+--------+----------------------------+           PSV cm/sEDV cm/sStenosisDescribeComments                     +----------+--------+--------+--------+--------+----------------------------+ CCA Prox  85      14                      minimal intimal wall changes +----------+--------+--------+--------+--------+----------------------------+ CCA Distal90      13                      minimal intimal wall changes +----------+--------+--------+--------+--------+----------------------------+ ICA Prox  67      15                      mild intimal wall changes    +----------+--------+--------+--------+--------+----------------------------+ ICA Mid   71      18                                                   +----------+--------+--------+--------+--------+----------------------------+ ICA Distal68      25                      tortuous                     +----------+--------+--------+--------+--------+----------------------------+  ECA       85      11                      mild intimal wall changes    +----------+--------+--------+--------+--------+----------------------------+ +----------+--------+-------+--------+-------------------+           PSV cm/sEDV cmsDescribeArm Pressure (mmHG) +----------+--------+-------+--------+-------------------+ [id number]                                        +----------+--------+-------+--------+-------------------+ +---------+--------+--+--------+-+ VertebralPSV cm/s42EDV cm/s7 +---------+--------+--+--------+-+  Left Carotid Findings: +----------+--------+--------+--------+--------+-------------------------+           PSV  cm/sEDV cm/sStenosisDescribeComments                  +----------+--------+--------+--------+--------+-------------------------+ CCA Prox  98      18                      mild intimal wall changes +----------+--------+--------+--------+--------+-------------------------+ CCA Distal98      18                      mild intimal wall changes +----------+--------+--------+--------+--------+-------------------------+ ICA Prox  68      10                      mild intimal wall changes +----------+--------+--------+--------+--------+-------------------------+ ICA Mid   70      24                                                +----------+--------+--------+--------+--------+-------------------------+ ICA Distal75      21                      tortuous                  +----------+--------+--------+--------+--------+-------------------------+ ECA       87      8                       mild intimal wall changes +----------+--------+--------+--------+--------+-------------------------+ +----------+--------+--------+--------+-------------------+ SubclavianPSV cm/sEDV cm/sDescribeArm Pressure (mmHG) +----------+--------+--------+--------+-------------------+           102                                         +----------+--------+--------+--------+-------------------+ +---------+--------+--+--------+--+ VertebralPSV cm/s51EDV cm/s12 +---------+--------+--+--------+--+  Summary: Right Carotid: There is no evidence of stenosis in the right ICA. Left Carotid: There is no evidence of stenosis in the left ICA. Vertebrals:  Bilateral vertebral arteries demonstrate antegrade flow. Subclavians: Normal flow hemodynamics were seen in bilateral subclavian              arteries. *See table(s) above for measurements and observations.     Preliminary     Microbiology: No results found for this or any previous visit (from the past 240 hour(s)).   Labs: Basic Metabolic  Panel: Recent Labs  Lab 05/14/18 1735 05/16/18 0317  NA 139 139  K 4.0 3.6  CL 107 105  CO2 26 25  GLUCOSE 99 92  BUN 17 21*  CREATININE 1.16 1.06  CALCIUM 9.2 9.2   Liver Function  Tests: Recent Labs  Lab 05/14/18 1735  AST 26  ALT 27  ALKPHOS 57  BILITOT 1.5*  PROT 8.1  ALBUMIN 4.7   No results for input(s): LIPASE, AMYLASE in the last 168 hours. No results for input(s): AMMONIA in the last 168 hours. CBC: Recent Labs  Lab 05/14/18 1735 05/16/18 0317  WBC 5.0 5.2  NEUTROABS 3.2 1.9  HGB 13.8 13.6  HCT 44.0 43.7  MCV 91.1 93.4  PLT 264 246   Cardiac Enzymes: No results for input(s): CKTOTAL, CKMB, CKMBINDEX, TROPONINI in the last 168 hours. BNP: BNP (last 3 results) No results for input(s): BNP in the last 8760 hours.  ProBNP (last 3 results) No results for input(s): PROBNP in the last 8760 hours.  CBG: Recent Labs  Lab 05/14/18 1714  GLUCAP 79       Signed:  Rhetta Mura MD   [facility] 05/16/2018, 1:07 PM

## 2018-05-16 NOTE — Progress Notes (Signed)
  Speech Language Pathology   Patient Details Name: [name] MRN: [id number] DOB: 06-15-1980 Today's Date: 05/16/2018 Time:  -      Pt screened for speech-language-cognitive impairments. No symptoms identified from pt or significant other/visitor present. No needs from informal conversation and formal assessment not needed.                Royce Macadamia 05/16/2018, 8:15 AM   Breck Coons Lonell Face.Ed Nurse, children's [phone] Office [phone]

## 2018-05-16 NOTE — Progress Notes (Signed)
Patient seen and examined yesterday 2/13 and was to be discharged but echo is pending pplease see my discharge summary for further details   No charge for today

## 2019-02-26 ENCOUNTER — Other Ambulatory Visit: Payer: Self-pay

## 2019-02-26 ENCOUNTER — Encounter (HOSPITAL_COMMUNITY): Payer: Self-pay | Admitting: Emergency Medicine

## 2019-02-26 ENCOUNTER — Ambulatory Visit (HOSPITAL_COMMUNITY)
Admission: EM | Admit: 2019-02-26 | Discharge: 2019-02-26 | Disposition: A | Discharge status: Home / Self Care | Payer: No Typology Code available for payment source | Attending: Family Medicine | Admitting: Family Medicine

## 2019-02-26 DIAGNOSIS — U071 COVID-19: Secondary | ICD-10-CM | POA: Insufficient documentation

## 2019-02-26 DIAGNOSIS — Z20828 Contact with and (suspected) exposure to other viral communicable diseases: Secondary | ICD-10-CM

## 2019-02-26 DIAGNOSIS — Z20822 Contact with and (suspected) exposure to covid-19: Secondary | ICD-10-CM

## 2019-02-26 NOTE — Discharge Instructions (Signed)
If your Covid-19 test is positive, you will get a phone call from [facility] regarding your results. If your Covid-19 test is negative, you will NOT get a phone call from [facility] with your results. You may view your results on MyChart. If you do not have a MyChart account, sign up instructions are in your discharge papers. ° °

## 2019-02-26 NOTE — ED Triage Notes (Signed)
Patient's daughter tested positive for covid.  Patient had to notify employer, employer requested he get tested.

## 2019-02-28 LAB — NOVEL CORONAVIRUS, NAA (HOSP ORDER, SEND-OUT TO REF LAB; TAT 18-24 HRS): SARS-CoV-2, NAA: DETECTED — AB

## 2019-03-01 ENCOUNTER — Encounter (HOSPITAL_COMMUNITY): Payer: Self-pay

## 2019-03-01 ENCOUNTER — Telehealth (HOSPITAL_COMMUNITY): Payer: Self-pay | Admitting: Emergency Medicine

## 2019-03-01 NOTE — Telephone Encounter (Signed)
Pt returned call, informed him of positive covid. Pt asked about retesting, informed him that its not recommended in a three month period, sent CDC guidelines in message. Pt verbalized understanding, all questions answered.

## 2019-03-01 NOTE — Telephone Encounter (Signed)
Your test for COVID-19 was positive, meaning that you were infected with the novel coronavirus and could give the germ to others.  Please continue isolation at home for at least 10 days since the start of your symptoms. If you do not have symptoms, please isolate at home for 10 days from the day you were tested. Once you complete your 10 day quarantine, you may return to normal activities as long as you've not had a fever for over 24 hours and your symptoms are improving. Please continue good preventive care measures, including:  frequent hand-washing, avoid touching your face, cover coughs/sneezes, stay out of crowds and keep a 6 foot distance from others.  Go to the nearest hospital emergency room if fever/cough/breathlessness are severe or illness seems like a threat to life.  Attempted to reach patient. No answer at this time. Voicemail left.    

## 2019-03-04 NOTE — ED Provider Notes (Signed)
[facility]   [id number] 02/26/19 Arrival Time: 3664  ASSESSMENT & PLAN:  1. Exposure to COVID-19 virus     COVID-19 testing sent. To self-quarantine until results are available.  Follow-up Information    [facility].   Specialty: Urgent Care Why: As needed. Contact information: [place] [phone]          Reviewed expectations re: course of current medical issues. Questions answered. Outlined signs and symptoms indicating need for more acute intervention. Patient verbalized understanding. After Visit Summary given.   SUBJECTIVE: History from: patient. Andrew Foley is a 38 y.o. male who requests COVID-19 testing. Known COVID-19 contact: patient's daughter with + test recently. Recent travel: none. Denies: runny nose, congestion, fever, cough, sore throat, difficulty breathing and headache. Needs to be tested in order to return to work.  ROS: As per HPI.   OBJECTIVE:  Vitals:   02/26/19 1831  BP: 124/75  Pulse: 70  Resp: 16  Temp: 98.5 F (36.9 C)  TempSrc: Oral  SpO2: 100%    General appearance: alert; no distress Eyes: PERRLA; EOMI; conjunctiva normal HENT: [place]; AT; nasal mucosa normal; oral mucosa normal Neck: supple  Lungs: respirations unlabored Heart: regular rate and rhythm Abdomen: soft, non-tender Extremities: no edema Skin: warm and dry Neurologic: normal gait Psychological: alert and cooperative; normal mood and affect    Allergies  Allergen Reactions  . Codeine Hives and Rash    Past Medical History:  Diagnosis Date  . Abscess   . Hemorrhoids   . Rectal pain   . Rheumatic fever    as a child   Social History   Socioeconomic History  . Marital status: Married    Spouse name: Fausto Skillern  . Number of children: 3  . Years of education: Not on file  . Highest education level: High school graduate  Occupational History  . Not on file   Social Needs  . Financial resource strain: Somewhat hard  . Food insecurity    Worry: Sometimes true    Inability: Never true  . Transportation needs    Medical: No    Non-medical: No  Tobacco Use  . Smoking status: Light Tobacco Smoker    Types: Cigars  . Smokeless tobacco: Never Used  . Tobacco comment: 2 cigars a week  Substance and Sexual Activity  . Alcohol use: Not Currently    Alcohol/week: 20.0 standard drinks    Types: 20 Cans of beer per week    Frequency: Never  . Drug use: No  . Sexual activity: Yes    Birth control/protection: None  Lifestyle  . Physical activity    Days per week: 7 days    Minutes per session: 150+ min  . Stress: Not at all  Relationships  . Social connections    Talks on phone: More than three times a week    Gets together: Twice a week    Attends religious service: More than 4 times per year    Active member of club or organization: Yes    Attends meetings of clubs or organizations: More than 4 times per year    Relationship status: Married  . Intimate partner violence    Fear of current or ex partner: Patient refused    Emotionally abused: Patient refused    Physically abused: Patient refused    Forced sexual activity: Patient refused  Other Topics Concern  . Not on file  Social History Narrative  . Not on file   Family History  Problem Relation Age of Onset  . Hyperlipidemia Father   . Hyperlipidemia Brother    Past Surgical History:  Procedure Laterality Date  . ADENOIDECTOMY    . ANAL EXAMINATION UNDER ANESTHESIA  06/01/2017   Dr Sheliah Hatch.  Brisk anal bleeding - cautery & suture control  . INCISION AND DRAINAGE ABSCESS ANAL  05/25/2017   [facility].  Dr Albesa Seen.  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 PILONIDAL CYST EXCISION       Mardella Layman, MD 03/04/19 [phone]

## 2019-03-07 ENCOUNTER — Ambulatory Visit (HOSPITAL_COMMUNITY)
Admission: EM | Admit: 2019-03-07 | Discharge: 2019-03-07 | Disposition: A | Discharge status: Home / Self Care | Payer: No Typology Code available for payment source | Attending: Urgent Care | Admitting: Urgent Care

## 2019-03-07 ENCOUNTER — Other Ambulatory Visit: Payer: Self-pay

## 2019-03-07 ENCOUNTER — Encounter (HOSPITAL_COMMUNITY): Payer: Self-pay

## 2019-03-07 DIAGNOSIS — R0602 Shortness of breath: Secondary | ICD-10-CM

## 2019-03-07 DIAGNOSIS — U071 COVID-19: Secondary | ICD-10-CM

## 2019-03-07 DIAGNOSIS — R0789 Other chest pain: Secondary | ICD-10-CM | POA: Diagnosis not present

## 2019-03-07 NOTE — ED Provider Notes (Signed)
[facility]   MRN: [id number] DOB: 05-Jan-1981  Subjective:   Andrew Foley is a 38 y.o. male presenting for 1 to 2-day history of mild intermittent midsternal chest pain with feeling of heart racing and palpitations, intermittent shortness of breath that is also mild.  Patient was diagnosed with COVID-19 on 02/26/2019.  He has improvement in all his symptoms, specifically fever, cough and body aches have all improved.  His girlfriend became concerned however with this new symptoms and wanted him to get checked.  He is not currently taking any medications on a regular basis.  Denies history of asthma, COPD.  He used to smoke cigarettes but is no longer doing this.  No current facility-administered medications for this encounter.   Current Outpatient Medications:  .  allopurinol (ZYLOPRIM) 100 MG tablet, Take 1 tablet (100 mg total) by mouth daily., Disp: 30 tablet, Rfl: 6 .  aspirin EC 81 MG tablet, Take 1 tablet (81 mg total) by mouth daily., Disp: 30 tablet, Rfl: 11   Allergies  Allergen Reactions  . Codeine Hives and Rash    Past Medical History:  Diagnosis Date  . Abscess   . Hemorrhoids   . Rectal pain   . Rheumatic fever    as a child     Past Surgical History:  Procedure Laterality Date  . ADENOIDECTOMY    . ANAL EXAMINATION UNDER ANESTHESIA  06/01/2017   Dr Kieth Brightly.  Brisk anal bleeding - cautery & suture control  . INCISION AND DRAINAGE ABSCESS ANAL  05/25/2017   [facility].  Dr Jackelyn Knife.  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 PILONIDAL CYST EXCISION      Family History  Problem Relation Age of Onset  . Breast cancer Mother   . Gout Mother   . Atrial fibrillation Mother   . Hyperlipidemia Father   . Hyperlipidemia Brother     Social History   Tobacco Use  . Smoking status: Light Tobacco Smoker    Types: Cigars  . Smokeless tobacco: Never Used  . Tobacco comment: 2 cigars  a week  Substance Use Topics  . Alcohol use: Not Currently    Alcohol/week: 20.0 standard drinks    Types: 20 Cans of beer per week    Frequency: Never  . Drug use: No    Review of Systems  Constitutional: Negative for fever and malaise/fatigue.  HENT: Negative for congestion, ear pain, sinus pain and sore throat.   Eyes: Negative for discharge and redness.  Respiratory: Positive for shortness of breath. Negative for cough, hemoptysis and wheezing.   Cardiovascular: Positive for chest pain and palpitations.  Gastrointestinal: Negative for abdominal pain, diarrhea, nausea and vomiting.  Genitourinary: Negative for dysuria, flank pain and hematuria.  Musculoskeletal: Negative for myalgias.  Skin: Negative for rash.  Neurological: Negative for dizziness, weakness and headaches.  Psychiatric/Behavioral: Negative for depression and substance abuse.     Objective:   Vitals: BP 129/78 (BP Location: Left Arm)   Pulse 73   Temp 98.5 F (36.9 C) (Oral)   Resp 18   Wt 187 lb (84.8 kg)   SpO2 100%   BMI 25.36 kg/m   Physical Exam Constitutional:      General: He is not in acute distress.    Appearance: Normal appearance. He is well-developed. He is not ill-appearing, toxic-appearing or diaphoretic.  HENT:     Head: Normocephalic and atraumatic.  Right Ear: External ear normal.     Left Ear: External ear normal.     Nose: Nose normal.     Mouth/Throat:     Mouth: Mucous membranes are moist.     Pharynx: Oropharynx is clear.  Eyes:     General: No scleral icterus.    Extraocular Movements: Extraocular movements intact.     Pupils: Pupils are equal, round, and reactive to light.  Cardiovascular:     Rate and Rhythm: Normal rate and regular rhythm.     Heart sounds: Normal heart sounds. No murmur. No friction rub. No gallop.   Pulmonary:     Effort: Pulmonary effort is normal. No respiratory distress.     Breath sounds: Normal breath sounds. No stridor. No wheezing,  rhonchi or rales.  Chest:     Chest wall: No tenderness.  Neurological:     Mental Status: He is alert and oriented to person, place, and time.  Psychiatric:        Mood and Affect: Mood normal.        Behavior: Behavior normal.        Thought Content: Thought content normal.     ED ECG REPORT   Date: 03/07/2019  Rate: 64bpm  Rhythm: normal sinus rhythm  QRS Axis: normal  Intervals: normal  ST/T Wave abnormalities: nonspecific T wave changes  Conduction Disutrbances:none  Narrative Interpretation: T wave inversion in lead III, aVF.  This is different for lead aVF compared to previous EKG from February but they also make note that he has had previous history of T wave inversion in lead III.  He is otherwise in sinus rhythm at 64 bpm.  Old EKG Reviewed: changes noted  I have personally reviewed the EKG tracing and agree with the computerized printout as noted.   Assessment and Plan :   1. Atypical chest pain   2. Shortness of breath   3. COVID-19 virus infection     Recommended supportive care for patient as his EKG is reassuring.  I do not suspect pericarditis, myocarditis or acute cardiopulmonary event given comparison between previous EKG and today's, physical exam findings.  This case was reviewed with Dr. Milus Glazier and is in agreement. Counseled patient on potential for adverse effects with medications prescribed/recommended today, ER and return-to-clinic precautions discussed, patient verbalized understanding.    Wallis Bamberg, [place] 03/07/19 1610

## 2019-03-07 NOTE — Discharge Instructions (Signed)
You may take 500mg-650mg Tylenol every 6 hours for pain and inflammation.  ° ° °

## 2019-03-07 NOTE — ED Triage Notes (Signed)
Pt. States Wed. He has had a rapid heartbeat & its diffcult to breathe since he has tested POSITIVE for COVI, also body aches.

## 2019-06-09 ENCOUNTER — Ambulatory Visit: Payer: No Typology Code available for payment source | Attending: Internal Medicine

## 2019-06-09 DIAGNOSIS — Z23 Encounter for immunization: Secondary | ICD-10-CM | POA: Insufficient documentation

## 2019-06-09 NOTE — Progress Notes (Signed)
   Covid-19 Vaccination Clinic  Name:  Andrew Foley    MRN: [id number] DOB: Nov 19, 1980  06/09/2019  [name] was observed post Covid-19 immunization for 15 minutes without incident. He was provided with Vaccine Information Sheet and instruction to access the V-Safe system.   [name] was instructed to call 911 with any severe reactions post vaccine: Marland Kitchen Difficulty breathing  . Swelling of face and throat  . A fast heartbeat  . A bad rash all over body  . Dizziness and weakness   Immunizations Administered    Name Date Dose VIS Date Route   Pfizer COVID-19 Vaccine 06/09/2019  9:06 AM 0.3 mL 03/14/2019 Intramuscular   Manufacturer: [employer], [employer]   Lot: [id number]   NDC: [address]-0

## 2019-06-16 MED FILL — ALLOPURINOL 100 MG TABS: 100 | 90 days supply | Qty: 90 | Fill #0

## 2019-06-16 MED FILL — FLUTICASONE PROP 50 MCG SPR: 50 | 60 days supply | Qty: 16 | Fill #0

## 2019-07-09 ENCOUNTER — Ambulatory Visit: Payer: No Typology Code available for payment source | Attending: Internal Medicine

## 2019-07-09 DIAGNOSIS — Z23 Encounter for immunization: Secondary | ICD-10-CM

## 2019-07-09 NOTE — Progress Notes (Signed)
   Covid-19 Vaccination Clinic  Name:  Andrew Foley    MRN: [id number] DOB: January 03, 1981  07/09/2019  [name] was observed post Covid-19 immunization for 15 minutes without incident. He was provided with Vaccine Information Sheet and instruction to access the V-Safe system.   [name] was instructed to call 911 with any severe reactions post vaccine: Marland Kitchen Difficulty breathing  . Swelling of face and throat  . A fast heartbeat  . A bad rash all over body  . Dizziness and weakness   Immunizations Administered    Name Date Dose VIS Date Route   Pfizer COVID-19 Vaccine 07/09/2019 10:39 AM 0.3 mL 03/14/2019 Intramuscular   Manufacturer: [employer], [employer]   Lot: [id number]   NDC: [address]-3

## 2020-01-19 ENCOUNTER — Other Ambulatory Visit (HOSPITAL_BASED_OUTPATIENT_CLINIC_OR_DEPARTMENT_OTHER): Payer: Self-pay | Admitting: Internal Medicine

## 2020-01-19 ENCOUNTER — Ambulatory Visit: Payer: Self-pay | Attending: Internal Medicine

## 2020-01-19 DIAGNOSIS — Z23 Encounter for immunization: Secondary | ICD-10-CM

## 2020-01-19 NOTE — Progress Notes (Signed)
   Covid-19 Vaccination Clinic  Name:  Andrew Foley    MRN: [id number] DOB: 16-Oct-1980  01/19/2020  Andrew Foley was observed post Covid-19 immunization for 15 minutes without incident. He was provided with Vaccine Information Sheet and instruction to access the V-Safe system.   Andrew Foley was instructed to call 911 with any severe reactions post vaccine: Marland Kitchen Difficulty breathing  . Swelling of face and throat  . A fast heartbeat  . A bad rash all over body  . Dizziness and weakness

## 2020-01-24 ENCOUNTER — Ambulatory Visit
Admission: RE | Admit: 2020-01-24 | Discharge: 2020-01-24 | Disposition: A | Discharge status: Home / Self Care | Payer: 59 | Source: Ambulatory Visit | Attending: Emergency Medicine | Admitting: Emergency Medicine

## 2020-01-24 ENCOUNTER — Other Ambulatory Visit: Payer: Self-pay

## 2020-01-24 VITALS — BP 122/83 | HR 56 | Temp 97.3°F | Resp 18

## 2020-01-24 DIAGNOSIS — S46912A Strain of unspecified muscle, fascia and tendon at shoulder and upper arm level, left arm, initial encounter: Secondary | ICD-10-CM

## 2020-01-24 MED ORDER — NAPROXEN 500 MG PO TABS: 500.0000 mg | ORAL_TABLET | Freq: Two times a day (BID) | ORAL | 0 refills | Status: DC

## 2020-01-24 MED ORDER — CYCLOBENZAPRINE HCL 5 MG PO TABS: 5.0000 mg | ORAL_TABLET | Freq: Two times a day (BID) | ORAL | 0 refills | Status: AC | PRN

## 2020-01-24 NOTE — ED Provider Notes (Signed)
[name]    CSN: [id number] Arrival date & time: 01/24/20  0859      History   Chief Complaint Chief Complaint  Patient presents with  . Appointment    900  . Chest Pain    HPI Andrew Foley is a 39 y.o. male  The medical history as below presenting for left-sided chest pain and upper back pain for the last few days.  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  Denies fall, specific injury.  No neck pain, numbness or weakness of affected side.  Denies central chest pain, difficulty breathing or palpitations.  No nausea or vomiting.  Has not tried thing for this.  Past Medical History:  Diagnosis Date  . Abscess   . Hemorrhoids   . Rectal pain   . Rheumatic fever    as a child    Patient Active Problem List   Diagnosis Date Noted  . TIA (transient ischemic attack) 05/14/2018  . Gout 05/14/2018  . Rectal bleeding s/p EUA/ligation 06/01/2017 06/01/2017  . Postoperative hemorrhagic shock from OSH surgery 06/01/2017  . Perianal abscess 05/16/2017    Past Surgical History:  Procedure Laterality Date  . ADENOIDECTOMY    . ANAL EXAMINATION UNDER ANESTHESIA  06/01/2017   Dr Sheliah Hatch.  Brisk anal bleeding - cautery & suture control  . INCISION AND DRAINAGE ABSCESS ANAL  05/25/2017   [place].  Dr Albesa Seen.  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 PILONIDAL CYST EXCISION         Home Medications    Prior to Admission medications   Medication Sig Start Date End Date Taking? Authorizing Provider  allopurinol (ZYLOPRIM) 100 MG tablet Take 1 tablet (100 mg total) by mouth daily. 10/23/17   [name], MD  cyclobenzaprine (FLEXERIL) 5 MG tablet Take 1 tablet (5 mg total) by mouth 2 (two) times daily as needed for up to 7 days for muscle spasms. 01/24/20 01/31/20  Hall-Potvin, [place], PA-C  naproxen (NAPROSYN) 500 MG tablet Take 1 tablet  (500 mg total) by mouth 2 (two) times daily. 01/24/20   Hall-Potvin, [place], PA-C  atorvastatin (LIPITOR) 10 MG tablet Take 1 tablet (10 mg total) by mouth daily at 6 PM. 05/15/18 02/26/19  Rhetta Mura, MD  colchicine 0.6 MG tablet Take 1 tablet (0.6 mg total) by mouth daily as needed (with gout flare.). 10/01/17 02/26/19  [name], MD  fluticasone (FLONASE) 50 MCG/ACT nasal spray Place 2 sprays into both nostrils daily. 07/12/17 02/26/19  [name], MD    Family History Family History  Problem Relation Age of Onset  . Breast cancer Mother   . Gout Mother   . Atrial fibrillation Mother   . Hyperlipidemia Father   . Hyperlipidemia Brother     Social History Social History   Tobacco Use  . Smoking status: Light Tobacco Smoker    Types: Cigars  . Smokeless tobacco: Never Used  . Tobacco comment: 2 cigars a week  Vaping Use  . Vaping Use: Never used  Substance Use Topics  . Alcohol use: Not Currently    Alcohol/week: 20.0 standard drinks    Types: 20 Cans of beer per week  . Drug use: No     Allergies   Codeine   Review of Systems As per HPI   Physical Exam Triage Vital Signs ED Triage Vitals  Enc Vitals Group     BP      Pulse      Resp      Temp      Temp src      SpO2      Weight      Height      Head Circumference      Peak Flow      Pain Score      Pain Loc      Pain Edu?      Excl. in GC?    No data found.  Updated Vital Signs BP 122/83 (BP Location: Right Arm)   Pulse (!) 56   Temp (!) 97.3 F (36.3 C) (Oral)   Resp 18   SpO2 99%   Visual Acuity Right Eye Distance:   Left Eye Distance:   Bilateral Distance:    Right Eye Near:   Left Eye Near:    Bilateral Near:     Physical Exam Constitutional:      General: He is not in acute distress. HENT:     Head: Normocephalic and atraumatic.  Eyes:     General: No scleral icterus.    Pupils: Pupils are equal, round, and reactive to light.  Cardiovascular:      Rate and Rhythm: Normal rate.  Pulmonary:     Effort: Pulmonary effort is normal. No respiratory distress.     Breath sounds: No wheezing.  Musculoskeletal:     Comments: Full active ROM of left shoulder, elbow, neck.  Neurovascular intact.  Patient does have left superolateral pectoralis tenderness as well as left superior trapezius tenderness.  No clavicular, AC joint, scapular tenderness  Skin:    Coloration: Skin is not jaundiced or pale.  Neurological:     Mental Status: He is alert and oriented to person, place, and time.      UC Treatments / Results  Labs (all labs ordered are listed, but only abnormal results are displayed) Labs Reviewed - No data to display  EKG   Radiology No results found.  Procedures Procedures (including critical care time)  Medications Ordered in UC Medications - No data to display  Initial Impression / Assessment and Plan / UC Course  I have reviewed the triage vital signs and the nursing notes.  Pertinent labs & imaging results that were available during my care of the patient were reviewed by me and considered in my medical decision making (see chart for details).     H&P consistent with MSK strain: We will treat supportively as below.  Provided sports medicine contact info if needed.  Return precautions discussed, pt verbalized understanding and is agreeable to plan. Final Clinical Impressions(s) / UC Diagnoses   Final diagnoses:  Left shoulder strain, initial encounter     Discharge Instructions     RICE: rest, ice, compression, elevation as needed for pain.    Heat therapy (hot compress, warm wash rag, hot showers, etc.) can help relax muscles and soothe muscle aches. Cold therapy (ice packs) can be used to help swelling both after injury and after prolonged use of areas of chronic pain/aches.  Pain medication:  500 mg Naprosyn/Aleve (naproxen) every 12 hours with food:  AVOID other NSAIDs while taking this (may have  Tylenol).  May take muscle relaxer as needed for severe pain / spasm.  (This medication may cause you to become tired so it is important you do not drink alcohol or operate heavy machinery while on this medication.  Recommend your first dose to be taken before bedtime to monitor for side effects safely)  Important to follow up with specialist(s) below for further evaluation/management if your symptoms persist or worsen.    ED Prescriptions    Medication Sig Dispense Auth. Provider   naproxen (NAPROSYN) 500 MG tablet Take 1 tablet (500 mg total) by mouth 2 (two) times daily. 30 tablet Hall-Potvin, [place], PA-C   cyclobenzaprine (FLEXERIL) 5 MG tablet Take 1 tablet (5 mg total) by mouth 2 (two) times daily as needed for up to 7 days for muscle spasms. 14 tablet Hall-Potvin, [place], PA-C     PDMP not reviewed this encounter.   Hall-Potvin, [place], [place] 01/24/20 0940

## 2020-01-24 NOTE — ED Triage Notes (Signed)
Pt here for left sided chest pain into shoulder that started while doing physical labor at work 2 days ago; pt sts pain with palpation and movement

## 2020-01-24 NOTE — Discharge Instructions (Signed)

## 2020-01-26 MED FILL — PFIZER-BIONTECH COVID-19 VA: 30 | 1 days supply | Qty: 0 | Fill #0

## 2020-04-05 ENCOUNTER — Telehealth: Payer: Self-pay | Admitting: Hematology and Oncology

## 2020-04-05 NOTE — Telephone Encounter (Signed)
Received an urgent hem referral from Dr. Tracie Harrier for neutropenia and leukopenia. Andrew Foley has been cld and scheduled to see Andrew Foley on 1/4 at 10am. Pt aware to arrive 30 minutes early.

## 2020-04-06 ENCOUNTER — Telehealth: Payer: Self-pay | Admitting: Hematology and Oncology

## 2020-04-06 ENCOUNTER — Encounter: Payer: 59 | Admitting: Hematology and Oncology

## 2020-04-06 ENCOUNTER — Other Ambulatory Visit: Payer: 59

## 2020-04-06 NOTE — Telephone Encounter (Signed)
Rescheduled appointment per 1/3 msg from MD. Spoke to patient who is aware of updated appointment date and time.

## 2020-04-07 ENCOUNTER — Telehealth (HOSPITAL_BASED_OUTPATIENT_CLINIC_OR_DEPARTMENT_OTHER): Payer: 59 | Admitting: Hematology and Oncology

## 2020-04-07 ENCOUNTER — Encounter: Payer: Self-pay | Admitting: Hematology and Oncology

## 2020-04-07 ENCOUNTER — Encounter: Payer: 59 | Admitting: Hematology and Oncology

## 2020-04-07 ENCOUNTER — Other Ambulatory Visit: Payer: 59

## 2020-04-07 DIAGNOSIS — D709 Neutropenia, unspecified: Secondary | ICD-10-CM

## 2020-04-07 NOTE — Progress Notes (Signed)
[name] CONSULT NOTE  Patient Care Team: [name], MD as PCP - General (Family Medicine) [name], MD as Consulting Physician (Surgery)  CHIEF COMPLAINTS/PURPOSE OF CONSULTATION:  Neutropenia  ASSESSMENT & PLAN:  No problem-specific Assessment & Plan notes found for this encounter.  No orders of the defined types were placed in this encounter.  This is a very pleasant 40 yr old male patient with no significant PMH referred to hematology for evaluation of neutropenia. Mr Andrew Foley is here for an initial visit via tele health. He absolutely denies any complaints. No B symptoms. No change in breathing, bowel habits or urinary habits. No new neurological complaints. PE not done, tele health visit. I have reviewed his labs which showed leukopenia, neutropenia, otherwise no findings. He reports no recurrent infections or concerning symptoms. 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  I have recommended that we proceed with additional lab evaluation and FU with me in 2 weeks for a physical exam as well. He can do the labs at that time. I have discussed common causes of neutropenia including but not limited to nutritional deficiencies, autoimmune diseases, viral infections, benign neutropenia and some leukemia like LGL. I will order repeat CBC, smear review, iron panel, [name], [name], flow cytometry. He already had TSH and hepatitis panel done by his PCP, negative. If the above mentioned evaluation is negative , then we can continue surveillance and evaluate if any changes.  HISTORY OF PRESENTING ILLNESS:  Andrew Foley 40 y.o. male is here because of neutropenia  Mr Andrew Foley is here for a video visit for initial evaluation of neutropenia. He went to see his PCP for an annual visit and was noticed to have persistent neutropenia hence recommended evaluation.  He denies any complaints. No fevers, drenching night  sweats, [name], change in bowel habits or urinary habits. Rest of the pertinent 10 point ROS reviewed and negative.  REVIEW OF SYSTEMS:   Constitutional: Denies fevers, [name], [name], nose, mouth, throat, [name], [name], [name], [name], [name], no new changes  All other systems were reviewed with the patient and are negative.  MEDICAL HISTORY:  Past Medical History:  Diagnosis Date  . Abscess   . Hemorrhoids   . Rectal pain   . Rheumatic fever    as a child    SURGICAL HISTORY: Past Surgical History:  Procedure Laterality Date  . ADENOIDECTOMY    . ANAL EXAMINATION UNDER ANESTHESIA  06/01/2017   Dr Sheliah Hatch.  Brisk anal bleeding - cautery & suture control  . INCISION AND DRAINAGE ABSCESS ANAL  05/25/2017   [place].  Dr Albesa Seen.  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 PILONIDAL CYST EXCISION      SOCIAL HISTORY: Social History   Socioeconomic History  . Marital status: Married    Spouse name: [name]  . Number of children: 3  . Years of  education: Not on file  . Highest education level: High school graduate  Occupational History  . Not on file  Tobacco Use  . Smoking status: Light Tobacco Smoker    Types: Cigars  . Smokeless tobacco: Never Used  . Tobacco comment: 2 cigars a week  Vaping Use  . Vaping Use: Never used   Substance and Sexual Activity  . Alcohol use: Not Currently    Alcohol/week: 20.0 standard drinks    Types: 20 Cans of beer per week  . Drug use: No  . Sexual activity: Yes    Birth control/protection: None  Other Topics Concern  . Not on file  Social History Narrative  . Not on file   Social Determinants of Health   Financial Resource Strain: Not on file  Food Insecurity: Not on file  Transportation Needs: Not on file  Physical Activity: Not on file  Stress: Not on file  Social Connections: Not on file  Intimate Partner Violence: Not on file    FAMILY HISTORY: Family History  Problem Relation Age of Onset  . Breast cancer Mother   . Gout Mother   . Atrial fibrillation Mother   . Hyperlipidemia Father   . Hyperlipidemia Brother     ALLERGIES:  is allergic to codeine.  MEDICATIONS:  Current Outpatient Medications  Medication Sig Dispense Refill  . allopurinol (ZYLOPRIM) 100 MG tablet Take 1 tablet (100 mg total) by mouth daily. 30 tablet 6  . naproxen (NAPROSYN) 500 MG tablet Take 1 tablet (500 mg total) by mouth 2 (two) times daily. 30 tablet 0   No current facility-administered medications for this visit.    PHYSICAL EXAMINATION:  PE not done, telehealth visit.  ECOG PERFORMANCE STATUS: 0 - Asymptomatic  There were no vitals filed for this visit. There were no vitals filed for this visit.  PE not done, tele health visit He appeared well and in no distress Mood is normal,  Answered all questions appropriately.  LABORATORY DATA:  I have reviewed the data as listed Lab Results  Component Value Date   WBC 5.2 05/16/2018   HGB 13.6 05/16/2018   HCT 43.7 05/16/2018   MCV 93.4 05/16/2018   PLT 246 05/16/2018     Chemistry      Component Value Date/Time   NA 139 05/16/2018 0317   NA 141 10/23/2017 1009   K 3.6 05/16/2018 0317   CL 105 05/16/2018 0317   CO2 25 05/16/2018 0317   BUN 21 (H) 05/16/2018 0317   BUN 11 10/23/2017 1009   CREATININE  1.06 05/16/2018 0317      Component Value Date/Time   CALCIUM 9.2 05/16/2018 0317   ALKPHOS 57 05/14/2018 1735   AST 26 05/14/2018 1735   ALT 27 05/14/2018 1735   BILITOT 1.5 (H) 05/14/2018 1735   BILITOT 1.5 (H) 10/23/2017 1009     I have reviewed labs for the past yrs. Since 2020, he has had leukopenia and neutropenia, no anemia, thrombocytopenia.   RADIOGRAPHIC STUDIES: I have personally reviewed the radiological images as listed and agreed with the findings in the report. No results found.  All questions were answered. The patient knows to call the clinic with any problems, questions or concerns.  I connected with  [facility] on 04/07/20 by a video enabled telemedicine application and verified that I am speaking with the correct person using two identifiers.   I discussed the limitations of evaluation and management by telemedicine. The patient expressed understanding and  agreed to proceed.  I spent 45 minutes in the care of this patient including review of records, History counseling and coordination of care.     Rachel Moulds, MD 04/07/2020 1:00 PM

## 2020-04-07 NOTE — Addendum Note (Signed)
Addended by: Jackquline Denmark on: 04/07/2020 08:01 PM   Modules accepted: Orders

## 2020-04-08 ENCOUNTER — Telehealth: Payer: Self-pay | Admitting: Hematology and Oncology

## 2020-04-08 NOTE — Telephone Encounter (Signed)
Scheduled appt per 1/5 sch msg - pt is aware of aptp date and time

## 2020-04-27 ENCOUNTER — Inpatient Hospital Stay: Payer: 59 | Attending: Hematology and Oncology

## 2020-04-27 ENCOUNTER — Other Ambulatory Visit: Payer: Self-pay

## 2020-04-27 DIAGNOSIS — D709 Neutropenia, unspecified: Secondary | ICD-10-CM | POA: Insufficient documentation

## 2020-04-27 LAB — CBC WITH DIFFERENTIAL/PLATELET
Abs Immature Granulocytes: 0.01 10*3/uL (ref 0.00–0.07)
Basophils Absolute: 0 10*3/uL (ref 0.0–0.1)
Basophils Relative: 1 %
Eosinophils Absolute: 0 10*3/uL (ref 0.0–0.5)
Eosinophils Relative: 1 %
HCT: 43.4 % (ref 39.0–52.0)
Hemoglobin: 14.2 g/dL (ref 13.0–17.0)
Immature Granulocytes: 0 %
Lymphocytes Relative: 56 %
Lymphs Abs: 1.8 10*3/uL (ref 0.7–4.0)
MCH: 30.1 pg (ref 26.0–34.0)
MCHC: 32.7 g/dL (ref 30.0–36.0)
MCV: 91.9 fL (ref 80.0–100.0)
Monocytes Absolute: 0.2 10*3/uL (ref 0.1–1.0)
Monocytes Relative: 6 %
Neutro Abs: 1.2 10*3/uL — ABNORMAL LOW (ref 1.7–7.7)
Neutrophils Relative %: 36 %
Platelets: 243 10*3/uL (ref 150–400)
RBC: 4.72 MIL/uL (ref 4.22–5.81)
RDW: 13.1 % (ref 11.5–15.5)
WBC: 3.3 10*3/uL — ABNORMAL LOW (ref 4.0–10.5)
nRBC: 0 % (ref 0.0–0.2)

## 2020-04-27 LAB — IRON AND TIBC
Iron: 119 ug/dL (ref 42–163)
Saturation Ratios: 34 % (ref 20–55)
TIBC: 348 ug/dL (ref 202–409)
UIBC: 229 ug/dL (ref 117–376)

## 2020-04-27 LAB — VITAMIN B12: Vitamin B-12: 852 pg/mL (ref 180–914)

## 2020-04-27 LAB — FERRITIN: Ferritin: 159 ng/mL (ref 24–336)

## 2020-04-28 LAB — FOLATE RBC
Folate, Hemolysate: 470 ng/mL
Folate, RBC: 1078 ng/mL (ref >498)
Hematocrit: 43.6 % (ref 37.5–51.0)

## 2020-04-28 LAB — PATHOLOGIST SMEAR REVIEW

## 2020-04-28 LAB — SURGICAL PATHOLOGY

## 2020-04-28 LAB — ANTINUCLEAR ANTIBODIES, IFA: ANA Ab, IFA: NEGATIVE

## 2020-04-29 ENCOUNTER — Encounter: Payer: Self-pay | Admitting: Hematology and Oncology

## 2020-04-29 ENCOUNTER — Other Ambulatory Visit: Payer: Self-pay

## 2020-04-29 ENCOUNTER — Inpatient Hospital Stay (HOSPITAL_BASED_OUTPATIENT_CLINIC_OR_DEPARTMENT_OTHER): Payer: 59 | Admitting: Hematology and Oncology

## 2020-04-29 VITALS — BP 114/64 | HR 60 | Temp 97.1°F | Resp 17 | Ht 72.0 in | Wt 199.0 lb

## 2020-04-29 DIAGNOSIS — D709 Neutropenia, unspecified: Secondary | ICD-10-CM

## 2020-04-29 LAB — FLOW CYTOMETRY

## 2020-04-29 NOTE — Progress Notes (Signed)
[facility] CONSULT NOTE  Patient Care Team: [name], MD as PCP - General (Family Medicine) [name], MD as Consulting Physician (Surgery)  CHIEF COMPLAINTS/PURPOSE OF CONSULTATION:  Neutropenia  ASSESSMENT & PLAN:  No problem-specific Assessment & Plan notes found for this encounter.  Orders Placed This Encounter  Procedures  . CBC with Differential/Platelet    Standing Status:   Standing    Number of Occurrences:   22    Standing Expiration Date:   05/30/2020   This is a very pleasant 40 yr old male patient with no significant PMH referred to hematology for evaluation of neutropenia.  I have reviewed his labs which showed leukopenia, neutropenia, [name], autoimmune diseases, viral infections, [name], smear review, iron panel, [name], [name], [name], negative. He is anxious about the possibility of bone marrow disorders, hence we discussed about BMB Repeat CBC in 4 weeks, if neutropenia persists, we will proceed with BMB.  HISTORY OF PRESENTING ILLNESS:  Andrew Foley 40 y.o. male is here because of neutropenia  Andrew Foley is here for a FU for evaluation of neutropenia. He went to see his PCP for an annual visit and was noticed to have persistent neutropenia hence recommended evaluation.  He denies any complaints. No fevers, drenching night sweats, [name], change in bowel habits or urinary habits. Rest of the pertinent 10 point ROS reviewed and negative.  REVIEW OF SYSTEMS:   Constitutional: Denies fevers, chills or abnormal night sweats Eyes: Denies  blurriness of vision, [name], nose, mouth, throat, [name], [name], [name], [name], [name], no new changes  All other systems were reviewed with the patient and are negative.  MEDICAL HISTORY:  Past Medical History:  Diagnosis Date  . Abscess   . Hemorrhoids   . Rectal pain   . Rheumatic fever    as a child    SURGICAL HISTORY: Past Surgical History:  Procedure Laterality Date  . ADENOIDECTOMY    . ANAL EXAMINATION UNDER ANESTHESIA  06/01/2017   Dr Sheliah Hatch.  Brisk anal bleeding - cautery & suture control  . INCISION AND DRAINAGE ABSCESS ANAL  05/25/2017   [place].  Dr Albesa Seen.  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 PILONIDAL CYST EXCISION      SOCIAL HISTORY: Social History   Socioeconomic History  . Marital status: Married    Spouse name: [name]  . Number of children: 3  . Years of education: Not on file  . Highest education level: High school graduate  Occupational History  . Not on file  Tobacco Use  . Smoking status: Light Tobacco Smoker    Types: Cigars  . Smokeless tobacco: Never Used  . Tobacco comment: 2 cigars a week  Vaping Use  .  Vaping Use: Never used  Substance and Sexual Activity  . Alcohol use: Not Currently    Alcohol/week: 20.0 standard drinks    Types: 20 Cans of beer per week  . Drug use: No  . Sexual activity: Yes    Birth control/protection: None  Other Topics Concern  . Not on file  Social History Narrative  . Not on file   Social Determinants of Health    Financial Resource Strain: Not on file  Food Insecurity: Not on file  Transportation Needs: Not on file  Physical Activity: Not on file  Stress: Not on file  Social Connections: Not on file  Intimate Partner Violence: Not on file    FAMILY HISTORY: Family History  Problem Relation Age of Onset  . Breast cancer Mother   . Gout Mother   . Atrial fibrillation Mother   . Hyperlipidemia Father   . Hyperlipidemia Brother     ALLERGIES:  is allergic to codeine.  MEDICATIONS:  Current Outpatient Medications  Medication Sig Dispense Refill  . allopurinol (ZYLOPRIM) 100 MG tablet Take 1 tablet (100 mg total) by mouth daily. 30 tablet 6  . naproxen (NAPROSYN) 500 MG tablet Take 1 tablet (500 mg total) by mouth 2 (two) times daily. (Patient not taking: Reported on 04/29/2020) 30 tablet 0   No current facility-administered medications for this visit.    PHYSICAL EXAMINATION:  PE not done, telehealth visit.  ECOG PERFORMANCE STATUS: 0 - Asymptomatic  Vitals:   04/29/20 1403  BP: 114/64  Pulse: 60  Resp: 17  Temp: (!) 97.1 F (36.2 C)  SpO2: 100%   Filed Weights   04/29/20 1403  Weight: 199 lb (90.3 kg)   Physical Activity: Not on file   Physical Exam Constitutional:      Appearance: Normal appearance.  HENT:     Head: Normocephalic and atraumatic.     Nose: Nose normal.     Mouth/Throat:     Mouth: Mucous membranes are moist.  Eyes:     Extraocular Movements: Extraocular movements intact.     Pupils: Pupils are equal, round, and reactive to light.  Cardiovascular:     Rate and Rhythm: Normal rate and regular rhythm.     Pulses: Normal pulses.     Heart sounds: Normal heart sounds.  Pulmonary:     Effort: Pulmonary effort is normal.     Breath sounds: Normal breath sounds.  Abdominal:     General: Abdomen is flat. Bowel sounds are normal.     Palpations: Abdomen is soft. There is no mass.     Tenderness: There is no abdominal tenderness. There is no  guarding.  Musculoskeletal:        General: Normal range of motion.     Cervical back: Normal range of motion and neck supple.  Lymphadenopathy:     Cervical: No cervical adenopathy.  Skin:    General: Skin is warm and dry.  Neurological:     General: No focal deficit present.     Mental Status: He is alert.  Psychiatric:        Mood and Affect: Mood normal.     LABORATORY DATA:  I have reviewed the data as listed Lab Results  Component Value Date   WBC 3.3 (L) 04/27/2020   HGB 14.2 04/27/2020   HCT 43.6 04/27/2020   HCT 43.4 04/27/2020   MCV 91.9 04/27/2020   PLT 243 04/27/2020     Chemistry      Component  Value Date/Time   NA 139 05/16/2018 0317   NA 141 10/23/2017 1009   K 3.6 05/16/2018 0317   CL 105 05/16/2018 0317   CO2 25 05/16/2018 0317   BUN 21 (H) 05/16/2018 0317   BUN 11 10/23/2017 1009   CREATININE 1.06 05/16/2018 0317      Component Value Date/Time   CALCIUM 9.2 05/16/2018 0317   ALKPHOS 57 05/14/2018 1735   AST 26 05/14/2018 1735   ALT 27 05/14/2018 1735   BILITOT 1.5 (H) 05/14/2018 1735   BILITOT 1.5 (H) 10/23/2017 1009     I have reviewed all his labs. No concerns except ongoing neutropenia.  RADIOGRAPHIC STUDIES: I have personally reviewed the radiological images as listed and agreed with the findings in the report. No results found.  All questions were answered. The patient knows to call the clinic with any problems, questions or concerns. .Discussed about BMB procedure in detail as well.  I spent 30 minutes in the care of this patient including review of records, History counseling and coordination of care.     Rachel Moulds, MD 04/29/2020 2:35 PM

## 2020-05-06 ENCOUNTER — Encounter: Payer: Self-pay | Admitting: Hematology and Oncology

## 2020-05-07 NOTE — Telephone Encounter (Signed)
Pt returned call. States he has food poisoning. Temp is back down to 98.5.

## 2020-05-07 NOTE — Telephone Encounter (Signed)
LM to call Dr Remonia Richter nurse to discuss his fever. Requested him call us back.

## 2020-05-24 ENCOUNTER — Inpatient Hospital Stay: Payer: 59 | Attending: Hematology and Oncology

## 2020-05-24 ENCOUNTER — Encounter: Payer: Self-pay | Admitting: Hematology and Oncology

## 2020-05-24 ENCOUNTER — Other Ambulatory Visit: Payer: Self-pay

## 2020-05-24 DIAGNOSIS — D709 Neutropenia, unspecified: Secondary | ICD-10-CM

## 2020-05-24 LAB — CBC WITH DIFFERENTIAL/PLATELET
Abs Immature Granulocytes: 0 10*3/uL (ref 0.00–0.07)
Basophils Absolute: 0 10*3/uL (ref 0.0–0.1)
Basophils Relative: 1 %
Eosinophils Absolute: 0.1 10*3/uL (ref 0.0–0.5)
Eosinophils Relative: 2 %
HCT: 44.1 % (ref 39.0–52.0)
Hemoglobin: 14.2 g/dL (ref 13.0–17.0)
Immature Granulocytes: 0 %
Lymphocytes Relative: 44 %
Lymphs Abs: 1.6 10*3/uL (ref 0.7–4.0)
MCH: 29.5 pg (ref 26.0–34.0)
MCHC: 32.2 g/dL (ref 30.0–36.0)
MCV: 91.7 fL (ref 80.0–100.0)
Monocytes Absolute: 0.3 10*3/uL (ref 0.1–1.0)
Monocytes Relative: 7 %
Neutro Abs: 1.7 10*3/uL (ref 1.7–7.7)
Neutrophils Relative %: 46 %
Platelets: 242 10*3/uL (ref 150–400)
RBC: 4.81 MIL/uL (ref 4.22–5.81)
RDW: 13.2 % (ref 11.5–15.5)
WBC: 3.6 10*3/uL — ABNORMAL LOW (ref 4.0–10.5)
nRBC: 0 % (ref 0.0–0.2)

## 2020-05-26 ENCOUNTER — Encounter: Payer: Self-pay | Admitting: Hematology and Oncology

## 2020-05-26 ENCOUNTER — Inpatient Hospital Stay (HOSPITAL_BASED_OUTPATIENT_CLINIC_OR_DEPARTMENT_OTHER): Payer: 59 | Admitting: Hematology and Oncology

## 2020-05-26 DIAGNOSIS — D709 Neutropenia, unspecified: Secondary | ICD-10-CM

## 2020-05-26 NOTE — Progress Notes (Signed)
[name], [name], [name], neutropenia, [name], autoimmune diseases, viral infections, [name], smear review, iron panel, [name], [name], [name], negative. His repeat CBC from 2 days ago showed resolved neutropenia, [name], if labs show worsening leukopenia or neutropenia, then we can consider bone marrow biopsy He is in agreement with this plan.  HISTORY OF PRESENTING ILLNESS:   Andrew Foley 40 y.o. male is here because of neutropenia  Andrew Foley is here for a FU for evaluation of neutropenia. He went to see his PCP for an annual visit and was noticed to have persistent neutropenia hence recommended evaluation.  He denies any complaints. No fevers, drenching night sweats, [name], [name], [name], [name], [name], nose, mouth, throat, [name], [name], [name], [name], [name], no new changes  All other systems were reviewed with the patient and are negative.  MEDICAL HISTORY:  Past Medical History:  Diagnosis Date  . Abscess   . Hemorrhoids   . Rectal pain   . Rheumatic fever    as a child    SURGICAL HISTORY: Past Surgical History:  Procedure Laterality Date  . ADENOIDECTOMY    . ANAL EXAMINATION UNDER ANESTHESIA  06/01/2017   Dr Kieth Brightly.  Brisk anal bleeding - cautery & suture control  . INCISION AND DRAINAGE ABSCESS ANAL  05/25/2017   [place].  Dr Jackelyn Knife.  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 PILONIDAL CYST EXCISION      SOCIAL HISTORY: Social History   Socioeconomic History  . Marital status: Married    Spouse name: Fausto Skillern  . Number of children: 3  . Years of education: Not on file  . Highest education level: High school graduate  Occupational History  . Not on file  Tobacco Use  . Smoking status: Light Tobacco Smoker    Types: Cigars  . Smokeless tobacco: Never Used  . Tobacco comment: 2 cigars a week  Vaping Use  . Vaping Use: Never used  Substance and Sexual Activity  . Alcohol use: Not Currently    Alcohol/week: 20.0 standard drinks    Types: 20 Cans of beer per week  . Drug use: No  . Sexual activity: Yes    Birth control/protection: None  Other Topics Concern  . Not on file  Social History Narrative   . Not on file   Social Determinants of Health   Financial Resource Strain: Not on file  Food Insecurity: Not on file  Transportation Needs: Not on file  Physical Activity: Not on file  Stress: Not on file  Social Connections: Not on file  Intimate Partner Violence: Not on file    FAMILY HISTORY: Family History  Problem Relation Age of Onset  . Breast cancer Mother   . Gout Mother   . Atrial fibrillation Mother   . Hyperlipidemia Father   . Hyperlipidemia Brother     ALLERGIES:  is allergic to codeine.  MEDICATIONS:  Current Outpatient Medications  Medication Sig Dispense Refill  . allopurinol (ZYLOPRIM) 100 MG tablet Take 1 tablet (100 mg total) by mouth daily. 30 tablet 6  . naproxen (NAPROSYN) 500 MG tablet Take 1 tablet (500 mg total) by mouth 2 (two) times daily. (Patient not taking: Reported on 04/29/2020) 30 tablet 0   No current facility-administered medications for this visit.    PHYSICAL EXAMINATION:  PE not done, [name], telephone visit.  LABORATORY DATA:  I have reviewed the data as listed Lab Results  Component Value Date   WBC 3.6 (L) 05/24/2020   HGB 14.2 05/24/2020   HCT 44.1 05/24/2020   MCV 91.7 05/24/2020   PLT 242 05/24/2020     Chemistry      Component Value Date/Time   NA 139 05/16/2018 0317   NA 141 10/23/2017 1009   K 3.6 05/16/2018 0317   CL 105 05/16/2018 0317   CO2 25 05/16/2018 0317   BUN 21 (H) 05/16/2018 0317   BUN 11 10/23/2017 1009   CREATININE 1.06 05/16/2018 0317      Component Value Date/Time   CALCIUM 9.2 05/16/2018 0317   ALKPHOS 57 05/14/2018 1735   AST 26 05/14/2018 1735   ALT 27 05/14/2018 1735   BILITOT 1.5 (H) 05/14/2018 1735   BILITOT 1.5 (H) 10/23/2017 1009     I have reviewed all his labs. No concerns except  ongoing leukopenia  RADIOGRAPHIC STUDIES: I have personally reviewed the radiological images as listed and agreed with the findings in the report. No results found.  All questions were answered. The patient knows to call the clinic with any problems, questions or concerns. Discussed about BMB procedure in detail as well.  I connected with  Lyn Records on 05/26/20 by a telephone application and verified that I am speaking with the correct person using two identifiers.   I discussed the limitations of evaluation and management by telemedicine. The patient expressed understanding and agreed to proceed.     Benay Pike, MD 05/26/2020 8:55 AM

## 2020-05-27 ENCOUNTER — Telehealth: Payer: Self-pay | Admitting: Hematology and Oncology

## 2020-05-27 NOTE — Telephone Encounter (Signed)
Scheduled appt per 2/23 sch msg - pt is aware of appt date and time   

## 2020-06-23 ENCOUNTER — Encounter: Payer: Self-pay | Admitting: Hematology and Oncology

## 2020-06-23 ENCOUNTER — Inpatient Hospital Stay: Payer: 59

## 2020-06-23 ENCOUNTER — Other Ambulatory Visit: Payer: Self-pay

## 2020-06-23 ENCOUNTER — Inpatient Hospital Stay: Payer: 59 | Attending: Hematology and Oncology | Admitting: Hematology and Oncology

## 2020-06-23 DIAGNOSIS — D709 Neutropenia, unspecified: Secondary | ICD-10-CM | POA: Diagnosis present

## 2020-06-23 LAB — CBC WITH DIFFERENTIAL/PLATELET
Abs Immature Granulocytes: 0.01 10*3/uL (ref 0.00–0.07)
Basophils Absolute: 0 10*3/uL (ref 0.0–0.1)
Basophils Relative: 1 %
Eosinophils Absolute: 0.1 10*3/uL (ref 0.0–0.5)
Eosinophils Relative: 2 %
HCT: 46.9 % (ref 39.0–52.0)
Hemoglobin: 15.2 g/dL (ref 13.0–17.0)
Immature Granulocytes: 0 %
Lymphocytes Relative: 48 %
Lymphs Abs: 1.8 10*3/uL (ref 0.7–4.0)
MCH: 29.3 pg (ref 26.0–34.0)
MCHC: 32.4 g/dL (ref 30.0–36.0)
MCV: 90.4 fL (ref 80.0–100.0)
Monocytes Absolute: 0.3 10*3/uL (ref 0.1–1.0)
Monocytes Relative: 8 %
Neutro Abs: 1.5 10*3/uL — ABNORMAL LOW (ref 1.7–7.7)
Neutrophils Relative %: 41 %
Platelets: 237 10*3/uL (ref 150–400)
RBC: 5.19 MIL/uL (ref 4.22–5.81)
RDW: 13 % (ref 11.5–15.5)
WBC: 3.7 10*3/uL — ABNORMAL LOW (ref 4.0–10.5)
nRBC: 0 % (ref 0.0–0.2)

## 2020-06-23 NOTE — Progress Notes (Signed)
[facility] CONSULT NOTE  Patient Care Team: [name], MD as PCP - General (Family Medicine) [name], MD as Consulting Physician (Surgery)  CHIEF COMPLAINTS/PURPOSE OF CONSULTATION:  Neutropenia  ASSESSMENT & PLAN:   Neutropenia (HCC) Neutropenia, unclear etiology I have discussed common causes of neutropenia including but not limited to nutritional deficiencies, autoimmune diseases, viral infections, [name], smear review, iron panel, [name], [name], [name], negative. Flow is negative. He is here for FU after repeat labs. Repeat CBC today shows stable neutropenia, leukopenia. No other CBC changes He absolutely denies any new health concerns, PE not done, tele visit. I have recommended to monitor CBC in 3 months, he was encouraged to report any new questions or concerns to [place]. He expressed understanding  Orders Placed This Encounter  Procedures  . CBC with Differential/Platelet    Standing Status:   Standing    Number of Occurrences:   22    Standing Expiration Date:   09/23/2020   HISTORY OF PRESENTING ILLNESS:   Andrew Foley 40 y.o. male is here because of neutropenia  Andrew Foley is here for a FU for evaluation of neutropenia. He is doing really well No B symptoms No interim infections or hospitalizations. No changes in breathing, bowel or urinary habits No new neurological complaints. He feels well overall. Rest of the pertinent 10 point ROS reviewed and negative.  REVIEW OF SYSTEMS:   Constitutional: Denies fevers, [name], [name], nose, mouth, throat, [name], [name], chest discomfort or lower extremity  swelling Gastrointestinal:  Denies nausea, [name], [name], no new changes  All other systems were reviewed with the patient and are negative.  MEDICAL HISTORY:  Past Medical History:  Diagnosis Date  . Abscess   . Hemorrhoids   . Rectal pain   . Rheumatic fever    as a child    SURGICAL HISTORY: Past Surgical History:  Procedure Laterality Date  . ADENOIDECTOMY    . ANAL EXAMINATION UNDER ANESTHESIA  06/01/2017   Dr Sheliah Hatch.  Brisk anal bleeding - cautery & suture control  . INCISION AND DRAINAGE ABSCESS ANAL  05/25/2017   [place].  Dr Albesa Seen.  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 PILONIDAL CYST EXCISION      SOCIAL HISTORY: Social History   Socioeconomic History  . Marital status: Married    Spouse name: [name]  . Number of children: 3  . Years of education: Not on file  . Highest education level: High school graduate  Occupational History  . Not on file  Tobacco Use  . Smoking status: Light Tobacco Smoker    Types: Cigars  . Smokeless tobacco: Never Used  . Tobacco comment: 2 cigars a week  Vaping Use  . Vaping Use: Never used  Substance and Sexual Activity  . Alcohol use: Not Currently    Alcohol/week: 20.0 standard drinks    Types: 20 Cans of beer per week  . Drug use: No  . Sexual activity: Yes    Birth  control/protection: None  Other Topics Concern  . Not on file  Social History Narrative  . Not on file   Social Determinants of Health   Financial Resource Strain: Not on file  Food Insecurity: Not on file  Transportation Needs: Not on file  Physical Activity: Not on file  Stress: Not on file  Social Connections: Not on file  Intimate Partner Violence: Not on file     FAMILY HISTORY: Family History  Problem Relation Age of Onset  . Breast cancer Mother   . Gout Mother   . Atrial fibrillation Mother   . Hyperlipidemia Father   . Hyperlipidemia Brother     ALLERGIES:  is allergic to codeine.  MEDICATIONS:  Current Outpatient Medications  Medication Sig Dispense Refill  . allopurinol (ZYLOPRIM) 100 MG tablet Take 1 tablet (100 mg total) by mouth daily. 30 tablet 6   No current facility-administered medications for this visit.    PHYSICAL EXAMINATION:  PE not done, [name], telehealth visit.  LABORATORY DATA:  I have reviewed the data as listed Lab Results  Component Value Date   WBC 3.7 (L) 06/23/2020   HGB 15.2 06/23/2020   HCT 46.9 06/23/2020   MCV 90.4 06/23/2020   PLT 237 06/23/2020     Chemistry      Component Value Date/Time   NA 139 05/16/2018 0317   NA 141 10/23/2017 1009   K 3.6 05/16/2018 0317   CL 105 05/16/2018 0317   CO2 25 05/16/2018 0317   BUN 21 (H) 05/16/2018 0317   BUN 11 10/23/2017 1009   CREATININE 1.06 05/16/2018 0317      Component Value Date/Time   CALCIUM 9.2 05/16/2018 0317   ALKPHOS 57 05/14/2018 1735   AST 26 05/14/2018 1735   ALT 27 05/14/2018 1735   BILITOT 1.5 (H) 05/14/2018 1735   BILITOT 1.5 (H) 10/23/2017 1009     I have reviewed all his labs. No concerns except ongoing leukopenia   RADIOGRAPHIC STUDIES: I have personally reviewed the radiological images as listed and agreed with the findings in the report. No results found.  All questions were answered. The patient knows to call the clinic with any problems, questions or concerns. Discussed about BMB procedure in detail as well.  I connected with  Hubbard Robinson on 06/23/20 by a telephone application and verified that I am speaking with  the correct person using two identifiers.   I discussed the limitations of evaluation and management by telemedicine. The patient expressed understanding and agreed to proceed.  I spent 15 minutes in the care of this patient including history, review of labs, discussions, documentation     Rachel Moulds, MD 06/23/2020 9:42 AM

## 2020-06-23 NOTE — Assessment & Plan Note (Signed)
Neutropenia, unclear etiology I have discussed common causes of neutropenia including but not limited to nutritional deficiencies, autoimmune diseases, viral infections, [name], smear review, iron panel, [name], [name], flow cytometry negative. Andrew Foley already had TSH and hepatitis panel done by his PCP, negative. Flow is negative. Andrew Foley is here for FU after repeat labs. Repeat CBC today shows stable neutropenia, leukopenia. No other CBC changes Andrew Foley absolutely denies any new health concerns, PE not done, tele visit. I have recommended to monitor CBC in 3 months, Andrew Foley was encouraged to report any new questions or concerns to [place]. Andrew Foley expressed understanding

## 2020-07-09 ENCOUNTER — Encounter: Payer: Self-pay | Admitting: Hematology and Oncology

## 2020-10-27 ENCOUNTER — Telehealth: Payer: Self-pay | Admitting: Hematology and Oncology

## 2020-10-27 ENCOUNTER — Encounter: Payer: Self-pay | Admitting: Hematology and Oncology

## 2020-10-27 NOTE — Telephone Encounter (Signed)
Scheduled appt per 7/27 sch msg. Pt aware.  

## 2020-10-28 ENCOUNTER — Inpatient Hospital Stay: Payer: 59 | Attending: Hematology and Oncology

## 2020-10-28 ENCOUNTER — Other Ambulatory Visit: Payer: Self-pay

## 2020-10-28 DIAGNOSIS — D709 Neutropenia, unspecified: Secondary | ICD-10-CM | POA: Insufficient documentation

## 2020-10-28 LAB — CBC WITH DIFFERENTIAL/PLATELET
Abs Immature Granulocytes: 0 10*3/uL (ref 0.00–0.07)
Basophils Absolute: 0 10*3/uL (ref 0.0–0.1)
Basophils Relative: 1 %
Eosinophils Absolute: 0.1 10*3/uL (ref 0.0–0.5)
Eosinophils Relative: 2 %
HCT: 42.6 % (ref 39.0–52.0)
Hemoglobin: 14 g/dL (ref 13.0–17.0)
Immature Granulocytes: 0 %
Lymphocytes Relative: 52 %
Lymphs Abs: 2 10*3/uL (ref 0.7–4.0)
MCH: 29.7 pg (ref 26.0–34.0)
MCHC: 32.9 g/dL (ref 30.0–36.0)
MCV: 90.4 fL (ref 80.0–100.0)
Monocytes Absolute: 0.3 10*3/uL (ref 0.1–1.0)
Monocytes Relative: 7 %
Neutro Abs: 1.4 10*3/uL — ABNORMAL LOW (ref 1.7–7.7)
Neutrophils Relative %: 38 %
Platelets: 210 10*3/uL (ref 150–400)
RBC: 4.71 MIL/uL (ref 4.22–5.81)
RDW: 13.1 % (ref 11.5–15.5)
WBC: 3.7 10*3/uL — ABNORMAL LOW (ref 4.0–10.5)
nRBC: 0 % (ref 0.0–0.2)

## 2020-11-24 DIAGNOSIS — M549 Dorsalgia, unspecified: Secondary | ICD-10-CM | POA: Diagnosis not present

## 2020-12-17 DIAGNOSIS — Z23 Encounter for immunization: Secondary | ICD-10-CM | POA: Diagnosis not present

## 2020-12-31 IMAGING — CT CT HEAD W/O CM
3 series · 15 of 46 positions shown, 18 images · non-contrast
Comparison: None.

CLINICAL DATA: Dizziness and right arm numbness starting this
morning.

EXAM:
CT HEAD WITHOUT CONTRAST
TECHNIQUE: Contiguous axial images were obtained from the base of the skull
through the vertex without intravenous contrast.

[Series 2: head wo · axial · 0.49mm/px · z∈[-81,+39]mm · 9 of 29 slices shown, 12 images]
[im 3/29  brain]
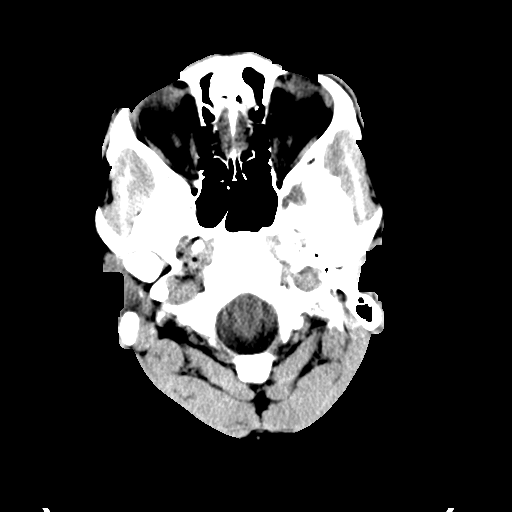
[im 3/29  bone]
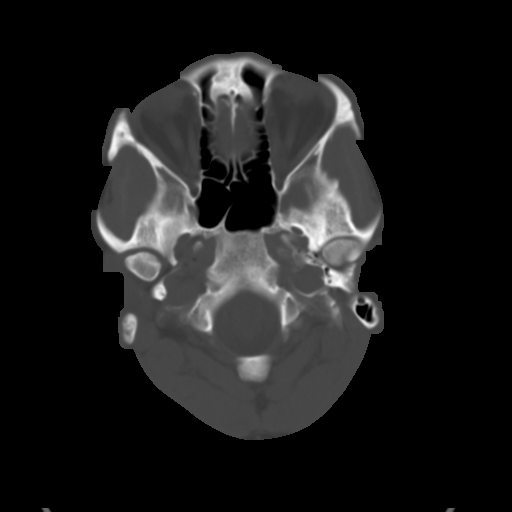
[im 6/29  brain]
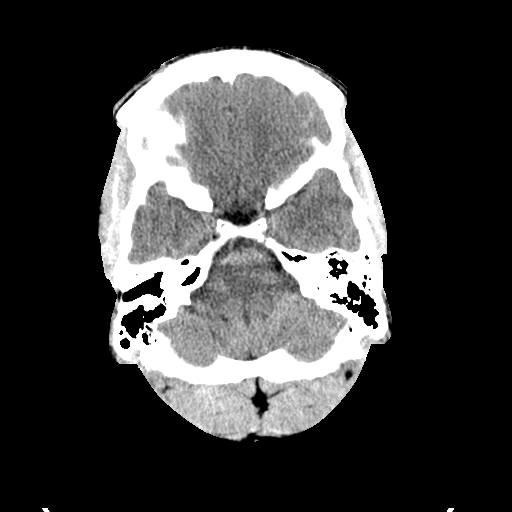
[im 9/29  brain]
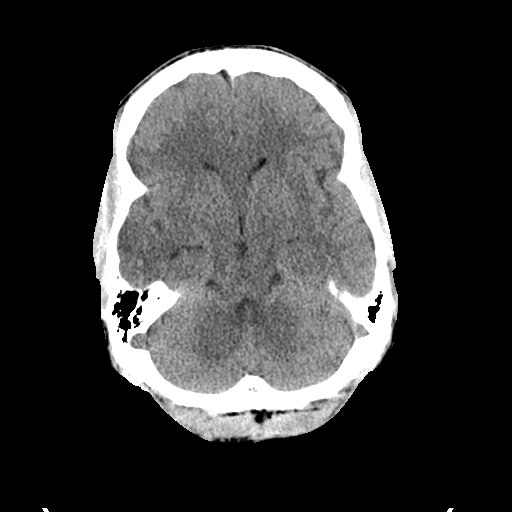
[im 12/29  brain]
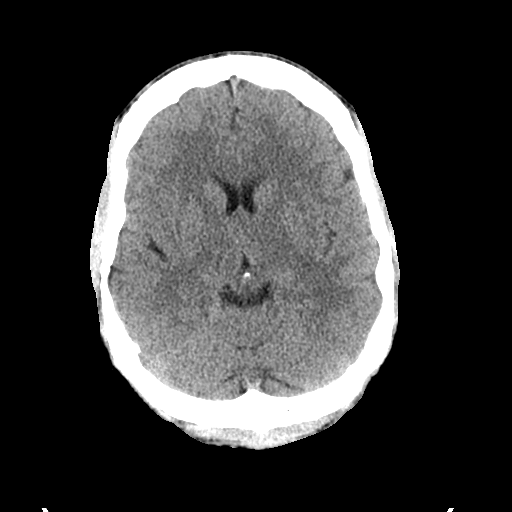
[im 15/29  brain]
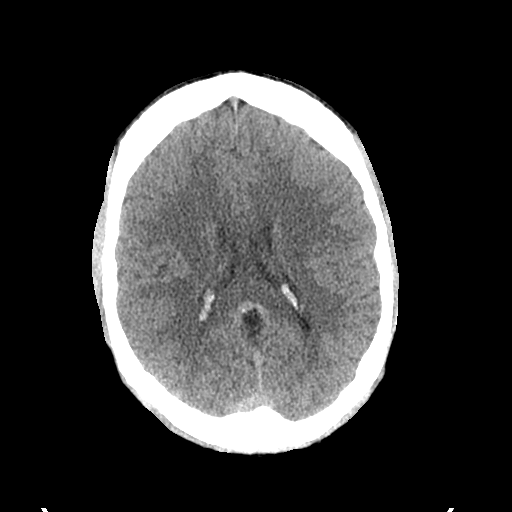
[im 15/29  bone]
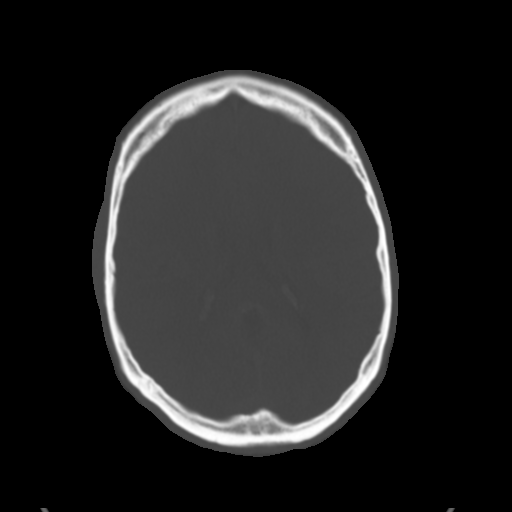
[im 18/29  brain]
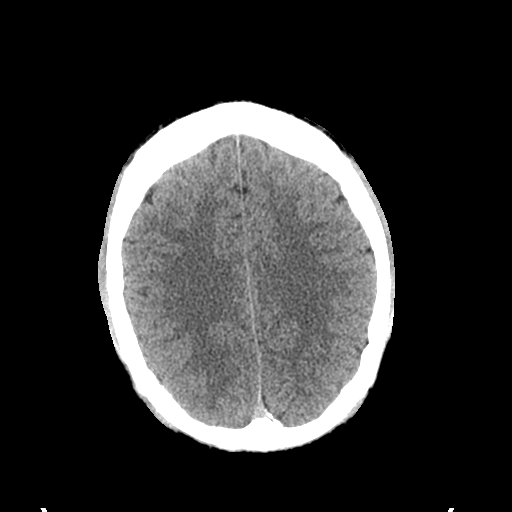
[im 21/29  brain]
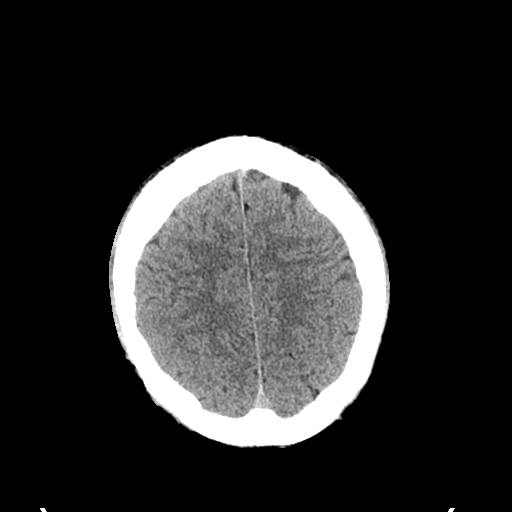
[im 24/29  brain]
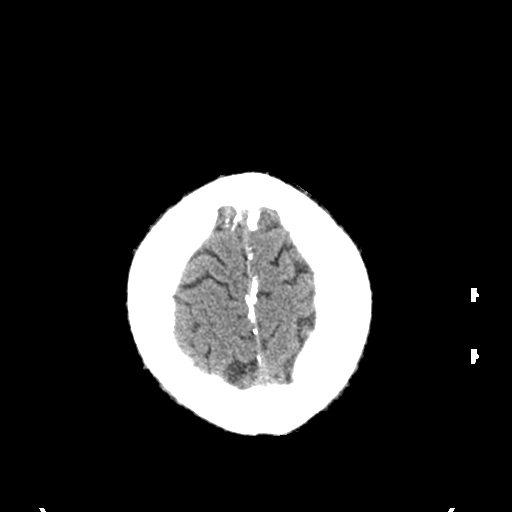
[im 27/29  brain]
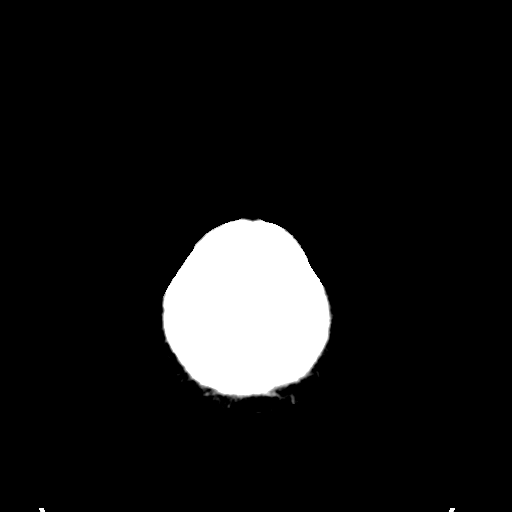
[im 27/29  bone]
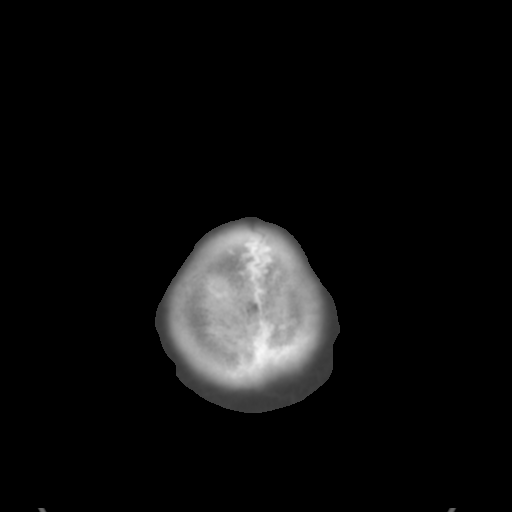

[Series 5: coronal soft tissue · coronal · 0.28mm/px · 3 of 67 slices shown]
[im 23/67  brain]
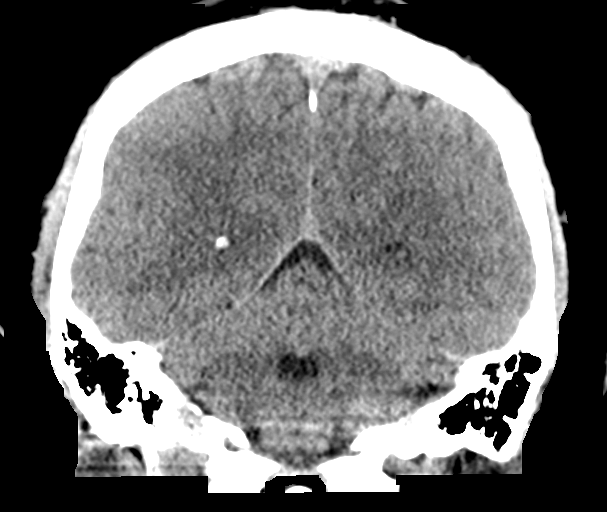
[im 30/67  brain]
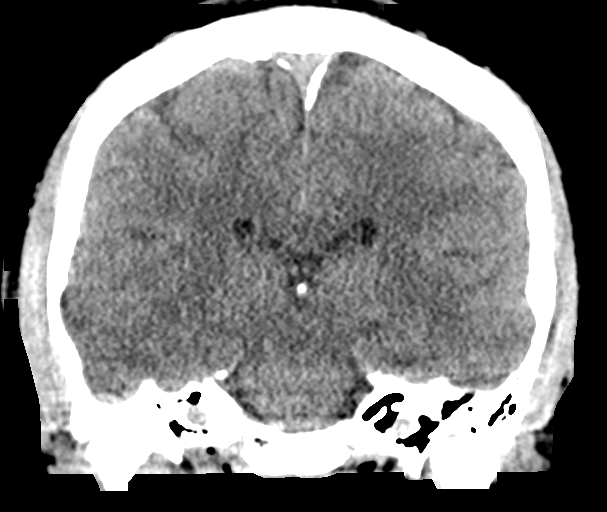
[im 37/67  brain]
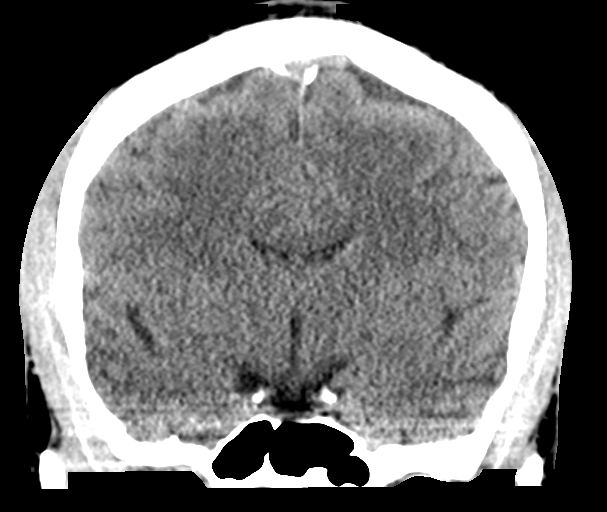

[Series 6: sagittal soft tissue · sagittal · 0.28mm/px · 3 of 54 slices shown]
[im 18/54  brain]
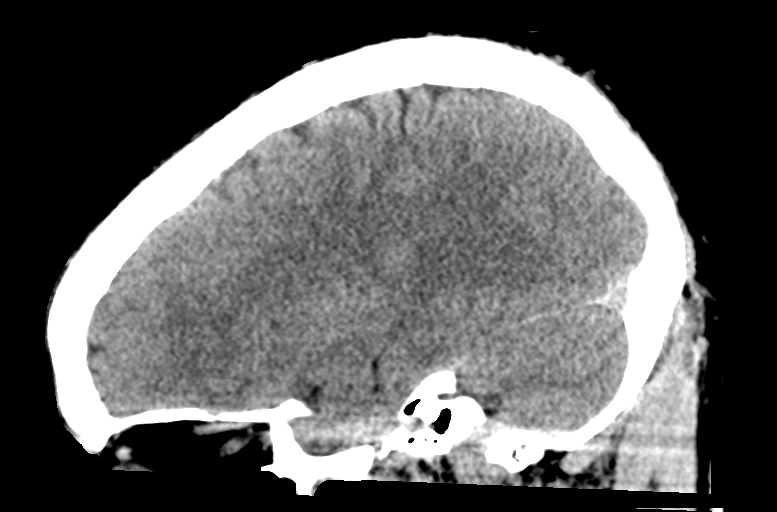
[im 27/54  brain]
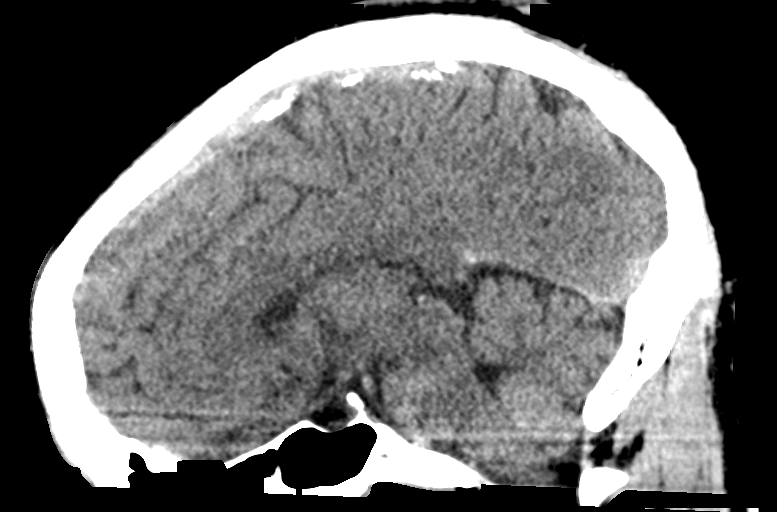
[im 36/54  brain]
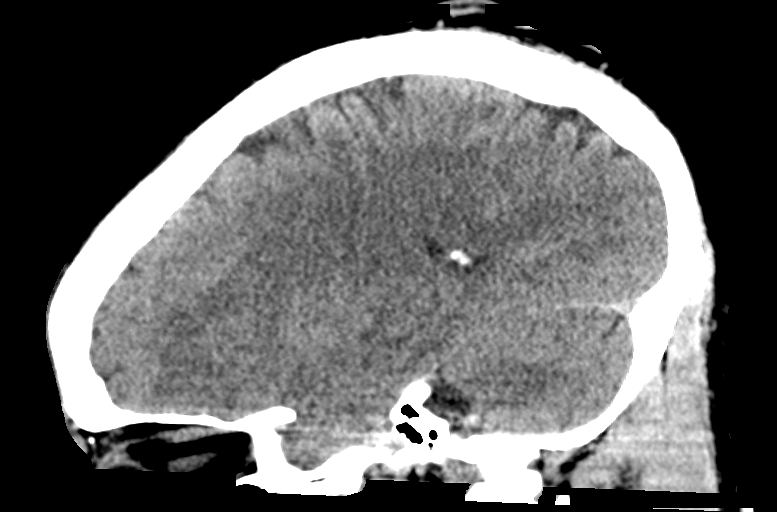

[15 of 46 positions shown; findings below may reference images not displayed]

FINDINGS: Brain: No evidence of acute infarction, hemorrhage, hydrocephalus,
extra-axial collection or mass lesion/mass effect.

Vascular: No hyperdense vessel or unexpected calcification.

Skull: Normal. Negative for fracture or focal lesion.

Sinuses/Orbits: No acute finding.

Other: None.
IMPRESSION: No focal acute intracranial abnormality identified.

## 2021-01-07 ENCOUNTER — Ambulatory Visit (INDEPENDENT_AMBULATORY_CARE_PROVIDER_SITE_OTHER): Payer: BC Managed Care – PPO

## 2021-01-07 ENCOUNTER — Encounter (HOSPITAL_COMMUNITY): Payer: Self-pay | Admitting: *Deleted

## 2021-01-07 ENCOUNTER — Ambulatory Visit (HOSPITAL_COMMUNITY)
Admission: EM | Admit: 2021-01-07 | Discharge: 2021-01-07 | Disposition: A | Discharge status: Home / Self Care | Payer: BC Managed Care – PPO | Attending: Internal Medicine | Admitting: Internal Medicine

## 2021-01-07 ENCOUNTER — Ambulatory Visit
Admission: RE | Admit: 2021-01-07 | Discharge status: Absent without leave | Payer: Self-pay | Source: Ambulatory Visit | Attending: Family Medicine | Admitting: Family Medicine

## 2021-01-07 ENCOUNTER — Other Ambulatory Visit: Payer: Self-pay

## 2021-01-07 DIAGNOSIS — M25572 Pain in left ankle and joints of left foot: Secondary | ICD-10-CM | POA: Diagnosis not present

## 2021-01-07 DIAGNOSIS — S99912A Unspecified injury of left ankle, initial encounter: Secondary | ICD-10-CM | POA: Diagnosis not present

## 2021-01-07 DIAGNOSIS — S93402A Sprain of unspecified ligament of left ankle, initial encounter: Secondary | ICD-10-CM | POA: Diagnosis not present

## 2021-01-07 MED ORDER — NAPROXEN 500 MG PO TABS: 500.0000 mg | ORAL_TABLET | Freq: Two times a day (BID) | ORAL | 0 refills | Status: DC

## 2021-01-07 NOTE — Discharge Instructions (Signed)
Your x-ray was normal with no evidence of a fracture.  We are placing you in a brace to help with the healing.  I also recommend that you keep your ankle elevated and use ice for additional symptom relief.  Take Naprosyn twice daily for pain and inflammation.  Do not take additional NSAIDs including aspirin, ibuprofen/Advil, naproxen/Aleve with this medication due to risk of GI bleeding.  You can use Tylenol for breakthrough pain.  Unfortunately, we are not able to give you work restrictions but we will write you out for several days.  If your symptoms or not improving I recommend you follow-up with an orthopedic provider as we discussed.  If you have any worsening symptoms please return for reevaluation.

## 2021-01-07 NOTE — ED Triage Notes (Signed)
Pt reports injury to Lt ankle while playing with son 1 1/2  weeks ago. Worse yesterday at work.

## 2021-01-07 NOTE — ED Provider Notes (Signed)
[facility]    CSN: [id number] Arrival date & time: 01/07/21  0915      History   Chief Complaint Chief Complaint  Patient presents with   Ankle Pain    HPI Andrew Foley is a 40 y.o. male.   Patient presents today with a 1 and half week long history of left ankle pain following injury.  Reports that he was playing with his son when he took a misstep resulting in inversion of the left ankle.  Since that time he has had ongoing pain that has worsened over the past several days.  He does have a physically demanding job that requires prolonged ambulation and believes this is contributing to ongoing symptoms.  Pain is rated 8 on a 0-10 pain scale, localized to lateral and medial left ankle, described as pulling/sharp, worse with palpation or ambulation, no alleviating factors identified.  He has tried ibuprofen as well as ice and elevation without improvement of symptoms.  Reports previous ankle sprains as well as fracture requiring casting when he was in elementary school to left ankle.  Denies any previous surgeries.  He is able to ambulate without assistance.   Past Medical History:  Diagnosis Date   Abscess    Hemorrhoids    Rectal pain    Rheumatic fever    as a child    Patient Active Problem List   Diagnosis Date Noted   Neutropenia (HCC) 06/23/2020   TIA (transient ischemic attack) 05/14/2018   Gout 05/14/2018   Rectal bleeding s/p EUA/ligation 06/01/2017 06/01/2017   Postoperative hemorrhagic shock from OSH surgery 06/01/2017   Perianal abscess 05/16/2017    Past Surgical History:  Procedure Laterality Date   ADENOIDECTOMY     ANAL EXAMINATION UNDER ANESTHESIA  06/01/2017   Dr Sheliah Hatch.  Brisk anal bleeding - cautery & suture control   INCISION AND DRAINAGE ABSCESS ANAL  05/25/2017   [place].  Dr Albesa Seen.  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 Authorizing Provider  naproxen (NAPROSYN) 500 MG tablet Take 1 tablet (500 mg total) by mouth 2 (two) times daily. 01/07/21  Yes Maelani Yarbro, Noberto Retort, PA-C  allopurinol (ZYLOPRIM) 100 MG tablet Take 1 tablet (100 mg total) by mouth daily. 10/23/17   [name], MD  COVID-19 mRNA vaccine, Pfizer, 30 MCG/0.3ML injection INJECT AS DIRECTED 01/19/20 01/18/21  Judyann Munson, MD  atorvastatin (LIPITOR) 10 MG tablet Take 1 tablet (10 mg total) by mouth daily at 6 PM. 05/15/18 02/26/19  Rhetta Mura, MD  colchicine 0.6 MG tablet Take 1 tablet (0.6 mg total) by mouth daily as needed (with gout flare.). 10/01/17 02/26/19  [name], MD  fluticasone (FLONASE) 50 MCG/ACT nasal spray Place 2 sprays into both nostrils daily. 07/12/17 02/26/19  [name], MD    Family History Family History  Problem Relation Age of Onset   Breast cancer Mother    Gout Mother    Atrial fibrillation Mother    Hyperlipidemia Father    Hyperlipidemia Brother     Social History Social History   Tobacco Use   Smoking status: Light Smoker    Types: Cigars   Smokeless tobacco: Never   Tobacco comments:    2 cigars a week  Vaping  Use   Vaping Use: Never used  Substance Use Topics   Alcohol use: Not Currently    Alcohol/week: 20.0 standard drinks    Types: 20 Cans of beer per week   Drug use: No     Allergies   Codeine   Review of Systems Review of Systems  Constitutional:  Positive for activity change. Negative for appetite change, fatigue and fever.  Respiratory:  Negative for cough and shortness of breath.   Cardiovascular:  Negative for chest pain.  Gastrointestinal:  Negative for abdominal pain, diarrhea, nausea and vomiting.  Musculoskeletal:  Positive for arthralgias, gait problem, joint swelling and myalgias. Negative for back pain.  Neurological:  Negative for dizziness,  weakness, light-headedness, numbness and headaches.    Physical Exam Triage Vital Signs ED Triage Vitals  Enc Vitals Group     BP 01/07/21 0958 119/80     Pulse --      Resp --      Temp --      Temp src --      SpO2 --      Weight --      Height --      Head Circumference --      Peak Flow --      Pain Score 01/07/21 0956 8     Pain Loc --      Pain Edu? --      Excl. in GC? --    No data found.  Updated Vital Signs BP 119/80   Pulse 60   Temp (!) 97.4 F (36.3 C)   Resp 18   SpO2 98%   Visual Acuity Right Eye Distance:   Left Eye Distance:   Bilateral Distance:    Right Eye Near:   Left Eye Near:    Bilateral Near:     Physical Exam Vitals reviewed.  Constitutional:      General: He is awake.     Appearance: Normal appearance. He is well-developed. He is not ill-appearing.     Comments: Very pleasant male appears stated age in no acute distress sitting comfortably in exam room  HENT:     Head: Normocephalic and atraumatic.  Cardiovascular:     Rate and Rhythm: Normal rate and regular rhythm.     Pulses:          Posterior tibial pulses are 2+ on the right side and 2+ on the left side.     Heart sounds: Normal heart sounds, S1 normal and S2 normal. No murmur heard. Pulmonary:     Effort: Pulmonary effort is normal.     Breath sounds: Normal breath sounds. No stridor. No wheezing, rhonchi or rales.     Comments: Clear to auscultation bilaterally Abdominal:     General: Bowel sounds are normal.     Palpations: Abdomen is soft.     Tenderness: There is no abdominal tenderness.  Musculoskeletal:     Left ankle: No swelling. Tenderness present over the lateral malleolus and medial malleolus. Decreased range of motion. Anterior drawer test negative.     Left Achilles Tendon: No tenderness. Thompson's test negative.     Comments: Left ankle: Tenderness to palpation at lateral and medial malleolus.  No deformity noted.  Decreased range of motion with  inversion and eversion.  Foot neurovascularly intact.  Neurological:     Mental Status: He is alert.  Psychiatric:        Behavior: Behavior is cooperative.     UC Treatments /  Results  Labs (all labs ordered are listed, but only abnormal results are displayed) Labs Reviewed - No data to display  EKG   Radiology DG Ankle Complete Left  Result Date: 01/07/2021 CLINICAL DATA:  Injury to left ankle while playing with son 1.5 weeks ago. Worse yesterday at work. EXAM: LEFT ANKLE COMPLETE - 3+ VIEW COMPARISON:  None. FINDINGS: There is no evidence of fracture or dislocation. Small posterior ankle joint effusion. Ankle mortise is intact. There is no evidence of arthropathy or other focal bone abnormality. Tiny posterior calcaneal enthesophyte. IMPRESSION: No fracture or dislocation.  Small posterior ankle joint effusion. Electronically Signed   By: Sherron Ales M.D.   On: 01/07/2021 10:48    Procedures Procedures (including critical care time)  Medications Ordered in UC Medications - No data to display  Initial Impression / Assessment and Plan / UC Course  I have reviewed the triage vital signs and the nursing notes.  Pertinent labs & imaging results that were available during my care of the patient were reviewed by me and considered in my medical decision making (see chart for details).      X-ray obtained given tenderness at medial and lateral malleolus showed no acute fracture.  Patient was placed in brace for comfort and support.  Recommended conservative treatment options including elevation and ice.  Discussed that we are unable provide work restrictions but will write him out for several days to allow healing.  If symptoms not improving he is to follow-up with orthopedics and was given contact information for local provider.  He was prescribed Naprosyn for pain and inflammation with instruction not to take NSAIDs with this medication due to risk of GI bleeding.  He can use Tylenol  for breakthrough pain.  Discussed alarm symptoms that warrant emergent evaluation.  Strict return precautions given to which he expressed understanding.  Final Clinical Impressions(s) / UC Diagnoses   Final diagnoses:  Sprain of left ankle, unspecified ligament, initial encounter  Acute left ankle pain     Discharge Instructions      Your x-ray was normal with no evidence of a fracture.  We are placing you in a brace to help with the healing.  I also recommend that you keep your ankle elevated and use ice for additional symptom relief.  Take Naprosyn twice daily for pain and inflammation.  Do not take additional NSAIDs including aspirin, ibuprofen/Advil, naproxen/Aleve with this medication due to risk of GI bleeding.  You can use Tylenol for breakthrough pain.  Unfortunately, we are not able to give you work restrictions but we will write you out for several days.  If your symptoms or not improving I recommend you follow-up with an orthopedic provider as we discussed.  If you have any worsening symptoms please return for reevaluation.     ED Prescriptions     Medication Sig Dispense Auth. Provider   naproxen (NAPROSYN) 500 MG tablet Take 1 tablet (500 mg total) by mouth 2 (two) times daily. 30 tablet Mataeo Ingwersen, Noberto Retort, PA-C      PDMP not reviewed this encounter.   Jeani Hawking, PA-C 01/07/21 1058

## 2021-04-12 DIAGNOSIS — M109 Gout, unspecified: Secondary | ICD-10-CM | POA: Diagnosis not present

## 2021-04-12 DIAGNOSIS — J302 Other seasonal allergic rhinitis: Secondary | ICD-10-CM | POA: Diagnosis not present

## 2021-04-12 DIAGNOSIS — R7303 Prediabetes: Secondary | ICD-10-CM | POA: Diagnosis not present

## 2021-04-12 DIAGNOSIS — D72819 Decreased white blood cell count, unspecified: Secondary | ICD-10-CM | POA: Diagnosis not present

## 2021-04-12 DIAGNOSIS — Z0000 Encounter for general adult medical examination without abnormal findings: Secondary | ICD-10-CM | POA: Diagnosis not present

## 2021-04-12 DIAGNOSIS — Z13220 Encounter for screening for lipoid disorders: Secondary | ICD-10-CM | POA: Diagnosis not present

## 2021-06-17 DIAGNOSIS — E7800 Pure hypercholesterolemia, unspecified: Secondary | ICD-10-CM | POA: Diagnosis not present

## 2022-04-01 ENCOUNTER — Other Ambulatory Visit: Payer: Self-pay

## 2022-04-01 ENCOUNTER — Encounter (HOSPITAL_BASED_OUTPATIENT_CLINIC_OR_DEPARTMENT_OTHER): Payer: Self-pay

## 2022-04-01 ENCOUNTER — Emergency Department (HOSPITAL_BASED_OUTPATIENT_CLINIC_OR_DEPARTMENT_OTHER): Payer: BC Managed Care – PPO

## 2022-04-01 ENCOUNTER — Emergency Department (HOSPITAL_BASED_OUTPATIENT_CLINIC_OR_DEPARTMENT_OTHER)
Admission: EM | Admit: 2022-04-01 | Discharge: 2022-04-01 | Disposition: A | Discharge status: Home / Self Care | Payer: BC Managed Care – PPO | Attending: Emergency Medicine | Admitting: Emergency Medicine

## 2022-04-01 DIAGNOSIS — R109 Unspecified abdominal pain: Secondary | ICD-10-CM | POA: Diagnosis not present

## 2022-04-01 DIAGNOSIS — Z8673 Personal history of transient ischemic attack (TIA), and cerebral infarction without residual deficits: Secondary | ICD-10-CM | POA: Diagnosis not present

## 2022-04-01 DIAGNOSIS — R1032 Left lower quadrant pain: Secondary | ICD-10-CM | POA: Insufficient documentation

## 2022-04-01 LAB — URINALYSIS, ROUTINE W REFLEX MICROSCOPIC
Bilirubin Urine: NEGATIVE
Glucose, UA: NEGATIVE mg/dL
Hgb urine dipstick: NEGATIVE
Ketones, ur: NEGATIVE mg/dL
Leukocytes,Ua: NEGATIVE
Nitrite: NEGATIVE
Protein, ur: NEGATIVE mg/dL
Specific Gravity, Urine: 1.034 — ABNORMAL HIGH (ref 1.005–1.030)
pH: 7.5 (ref 5.0–8.0)

## 2022-04-01 LAB — CBC
HCT: 44.7 % (ref 39.0–52.0)
Hemoglobin: 14.5 g/dL (ref 13.0–17.0)
MCH: 30.1 pg (ref 26.0–34.0)
MCHC: 32.4 g/dL (ref 30.0–36.0)
MCV: 92.9 fL (ref 80.0–100.0)
Platelets: 240 10*3/uL (ref 150–400)
RBC: 4.81 MIL/uL (ref 4.22–5.81)
RDW: 13.2 % (ref 11.5–15.5)
WBC: 3.3 10*3/uL — ABNORMAL LOW (ref 4.0–10.5)
nRBC: 0 % (ref 0.0–0.2)

## 2022-04-01 LAB — COMPREHENSIVE METABOLIC PANEL
ALT: 25 U/L (ref 0–44)
AST: 29 U/L (ref 15–41)
Albumin: 4.8 g/dL (ref 3.5–5.0)
Alkaline Phosphatase: 48 U/L (ref 38–126)
Anion gap: 9 (ref 5–15)
BUN: 17 mg/dL (ref 6–20)
CO2: 29 mmol/L (ref 22–32)
Calcium: 9.8 mg/dL (ref 8.9–10.3)
Chloride: 102 mmol/L (ref 98–111)
Creatinine, Ser: 1.2 mg/dL (ref 0.61–1.24)
GFR, Estimated: >60 mL/min (ref >60)
Glucose, Bld: 96 mg/dL (ref 70–99)
Potassium: 3.5 mmol/L (ref 3.5–5.1)
Sodium: 140 mmol/L (ref 135–145)
Total Bilirubin: 0.8 mg/dL (ref 0.3–1.2)
Total Protein: 8.1 g/dL (ref 6.5–8.1)

## 2022-04-01 LAB — LIPASE, BLOOD: Lipase: 19 U/L (ref 11–51)

## 2022-04-01 MED ORDER — LIDOCAINE 5 % EX PTCH: 1.0000 | MEDICATED_PATCH | CUTANEOUS | 0 refills | Status: DC

## 2022-04-01 MED ORDER — CYCLOBENZAPRINE HCL 10 MG PO TABS: 10.0000 mg | ORAL_TABLET | Freq: Two times a day (BID) | ORAL | 0 refills | Status: DC | PRN

## 2022-04-01 MED ORDER — VALACYCLOVIR HCL 1 G PO TABS: 1000.0000 mg | ORAL_TABLET | Freq: Three times a day (TID) | ORAL | 0 refills | Status: AC

## 2022-04-01 MED ORDER — IOHEXOL 300 MG/ML  SOLN
80.0000 mL | Freq: Once | INTRAMUSCULAR | Status: AC | PRN
  Administered 2022-04-01: 100 mL via INTRAVENOUS

## 2022-04-01 NOTE — Discharge Instructions (Signed)
Your history, exam and workup today were overall reassuring.  No evidence of acute diverticulitis, kidney stone, or other intra-abdominal pathology.  You did not have evidence of urinary tract infection or pyelonephritis and the rest of the workup was also reassuring.  Given your history of shingles and the pain feeling somewhat similar, we do to give you prescription for Valtrex to fill if you develop a rash.  Otherwise, please use the muscle relaxant and Lidoderm patches to help with what I suspect is more muscular pain as it worsens with twisting with some spasm.  If any symptoms change or worsen acutely, please return to the nearest emergency department.  Please follow with your primary doctor.

## 2022-04-01 NOTE — ED Provider Notes (Signed)
[facility] EMERGENCY DEPT Provider Note   CSN: [id number] Arrival date & time: 04/01/22  6384     History  Chief Complaint  Patient presents with   Abdominal Pain    Andrew Foley is a 41 y.o. male.  The history is provided by the patient and medical records. No language interpreter was used.  Abdominal Pain Pain location:  LLQ Pain quality: aching and cramping   Pain radiates to:  Does not radiate Pain severity:  Severe Onset quality:  Gradual Duration:  1 week Timing:  Constant Progression:  Waxing and waning Chronicity:  New Context: not trauma   Worsened by:  Palpation Ineffective treatments:  None tried Associated symptoms: no chest pain, no chills, no constipation, no cough, no diarrhea, no dysuria, no fatigue, no fever, no flatus, no hematemesis, no hematochezia, no hematuria, no melena, no nausea, no shortness of breath and no vomiting        Home Medications Prior to Admission medications   Medication Sig Start Date End Date Taking? Authorizing Provider  allopurinol (ZYLOPRIM) 100 MG tablet Take 1 tablet (100 mg total) by mouth daily. 10/23/17   [name], MD  naproxen (NAPROSYN) 500 MG tablet Take 1 tablet (500 mg total) by mouth 2 (two) times daily. 01/07/21   Raspet, Noberto Retort, PA-C  atorvastatin (LIPITOR) 10 MG tablet Take 1 tablet (10 mg total) by mouth daily at 6 PM. 05/15/18 02/26/19  Rhetta Mura, MD  colchicine 0.6 MG tablet Take 1 tablet (0.6 mg total) by mouth daily as needed (with gout flare.). 10/01/17 02/26/19  [name], MD  fluticasone (FLONASE) 50 MCG/ACT nasal spray Place 2 sprays into both nostrils daily. 07/12/17 02/26/19  [name], MD      Allergies    Codeine    Review of Systems   Review of Systems  Constitutional:  Negative for chills, fatigue and fever.  HENT:  Negative for congestion.   Respiratory:  Negative for cough, chest tightness and shortness of breath.   Cardiovascular:   Negative for chest pain.  Gastrointestinal:  Positive for abdominal pain. Negative for abdominal distention, blood in stool, constipation, diarrhea, flatus, hematemesis, hematochezia, melena, nausea and vomiting.  Genitourinary:  Negative for dysuria, flank pain, frequency and hematuria.  Musculoskeletal:  Negative for back pain, neck pain and neck stiffness.  Skin:  Negative for rash.  Neurological:  Negative for headaches.  Psychiatric/Behavioral:  Negative for agitation.   All other systems reviewed and are negative.   Physical Exam Updated Vital Signs BP (!) 130/94 (BP Location: Right Arm)   Pulse 61   Temp 98.4 F (36.9 C) (Oral)   Resp 16   SpO2 100%  Physical Exam Vitals and nursing note reviewed.  Constitutional:      General: He is not in acute distress.    Appearance: He is well-developed.  HENT:     Head: Normocephalic and atraumatic.     Mouth/Throat:     Mouth: Mucous membranes are moist.  Eyes:     Conjunctiva/sclera: Conjunctivae normal.  Cardiovascular:     Rate and Rhythm: Normal rate and regular rhythm.     Heart sounds: No murmur heard. Pulmonary:     Effort: Pulmonary effort is normal. No respiratory distress.     Breath sounds: Normal breath sounds.  Abdominal:     General: Abdomen is flat. Bowel sounds are normal. There is no distension.     Palpations: Abdomen is soft.  Tenderness: There is abdominal tenderness in the left lower quadrant. There is no right CVA tenderness, left CVA tenderness, guarding or rebound.  Genitourinary:    Comments: Deferred by pt when offered Musculoskeletal:        General: No swelling.     Cervical back: Neck supple.  Skin:    General: Skin is warm and dry.     Capillary Refill: Capillary refill takes less than 2 seconds.     Coloration: Skin is not pale.     Findings: No rash.  Neurological:     General: No focal deficit present.     Mental Status: He is alert.  Psychiatric:        Mood and Affect: Mood  normal.     ED Results / Procedures / Treatments   Labs (all labs ordered are listed, but only abnormal results are displayed) Labs Reviewed  CBC - Abnormal; Notable for the following components:      Result Value   WBC 3.3 (*)    All other components within normal limits  URINALYSIS, ROUTINE W REFLEX MICROSCOPIC - Abnormal; Notable for the following components:   Color, Urine COLORLESS (*)    Specific Gravity, Urine 1.034 (*)    All other components within normal limits  LIPASE, BLOOD  COMPREHENSIVE METABOLIC PANEL    EKG None  Radiology CT ABDOMEN PELVIS W CONTRAST  Result Date: 04/01/2022 CLINICAL DATA:  Worsening left lower quadrant pain for 1 week. EXAM: CT ABDOMEN AND PELVIS WITH CONTRAST TECHNIQUE: Multidetector CT imaging of the abdomen and pelvis was performed using the standard protocol following bolus administration of intravenous contrast. RADIATION DOSE REDUCTION: This exam was performed according to the departmental dose-optimization program which includes automated exposure control, adjustment of the mA and/or kV according to patient size and/or use of iterative reconstruction technique. CONTRAST:  100mL OMNIPAQUE IOHEXOL 300 MG/ML  SOLN COMPARISON:  None Available. FINDINGS: Lower Chest: No acute findings. Hepatobiliary: No hepatic masses identified. Gallbladder is unremarkable. No evidence of biliary ductal dilatation. Pancreas:  No mass or inflammatory changes. Spleen: Within normal limits in size and appearance. Adrenals/Urinary Tract: No suspicious masses identified. No evidence of ureteral calculi or hydronephrosis. Unremarkable unopacified urinary bladder. Stomach/Bowel: No evidence of obstruction, inflammatory process or abnormal fluid collections. Normal appendix visualized. No evidence of diverticular disease. Vascular/Lymphatic: No pathologically enlarged lymph nodes. No acute vascular findings. Reproductive:  No mass or other significant abnormality. Other:   None. Musculoskeletal:  No suspicious bone lesions identified. IMPRESSION: Negative. No acute findings or other significant abnormality. Electronically Signed   By: Danae OrleansJohn A Stahl M.D.   On: 04/01/2022 09:24    Procedures Procedures    Medications Ordered in ED Medications  iohexol (OMNIPAQUE) 300 MG/ML solution 80 mL (100 mLs Intravenous Contrast Given 04/01/22 0913)    ED Course/ Medical Decision Making/ A&P                           Medical Decision Making Amount and/or Complexity of Data Reviewed Labs: ordered. Radiology: ordered.  Risk Prescription drug management.    Andrew Foley is a 41 y.o. male with a past medical history significant for gout, previous TIA, neutropenia, and previous perianal abscess requiring surgery who presents with left lower quadrant abdominal pain.  According to patient, for the last week he had left lower quadrant abdominal pain that has waxed and waned but the last 2 days has been more severe.  Is not  absent severity is worst.  He reports no fevers, chills, nausea, vomiting, constipation, or diarrhea.  Denies any urinary changes and denies any flank or back pain.  Denies hematuria or history of kidney stones.  Denies any groin or testicle or scrotal pain.  Denies trauma.  Denies rash to suggest shingles.  Denies any pain in his arms or legs and otherwise is feeling well.  He does say he does some lifting at work but has not felt a hernia and is unsure if he pulled a muscle in his abdomen.  On exam, lungs clear and chest nontender.  Abdomen is tender in the left lower quadrant primarily.  Bowel sounds are appreciated.  Back nontender and flanks nontender.  No rash seen.  Good pulses in extremities.  Patient resting comfortably.  Due to the patient's left lower quadrant abdominal pain we will get CT scan to rule out diverticulitis, abscess, or other acute abnormality.  We will get screening labs.  Patient reports he did drive today and so wants to hold off  on pain medicine at this time.  He was offered a scrotal and groin exam but he said he was not hurting there and agreed to hold on that exam at this time.  Did not palpate a inguinal hernia on initial external exam.  Anticipate reassessment after imaging and labs.  Workup returned overall reassuring.  No evidence of acute diverticulitis or intra-abdominal pathology.  We went over all the findings together and I suspect this is more musculoskeletal and he agrees.  Will give prescription for muscle relaxant and Lidoderm patches but will also give prescription for Valtrex as he now says this could feel similar to when he had pain with previous shingles.  He knows to fill the prescription for Valtrex if rash develops.  He had otherwise no concerns and was discharged in good condition.         Final Clinical Impression(s) / ED Diagnoses Final diagnoses:  Left lower quadrant abdominal pain    Rx / DC Orders ED Discharge Orders          Ordered    cyclobenzaprine (FLEXERIL) 10 MG tablet  2 times daily PRN        04/01/22 1256    lidocaine (LIDODERM) 5 %  Every 24 hours        04/01/22 1256    valACYclovir (VALTREX) 1000 MG tablet  3 times daily        04/01/22 1256           Clinical Impression: 1. Left lower quadrant abdominal pain     Disposition: Discharge  Condition: Good  I have discussed the results, Dx and Tx plan with the pt(& family if present). He/she/they expressed understanding and agree(s) with the plan. Discharge instructions discussed at great length. Strict return precautions discussed and pt &/or family have verbalized understanding of the instructions. No further questions at time of discharge.    New Prescriptions   CYCLOBENZAPRINE (FLEXERIL) 10 MG TABLET    Take 1 tablet (10 mg total) by mouth 2 (two) times daily as needed for muscle spasms.   LIDOCAINE (LIDODERM) 5 %    Place 1 patch onto the skin daily. Remove & Discard patch within 12 hours or as  directed by MD   VALACYCLOVIR (VALTREX) 1000 MG TABLET    Take 1 tablet (1,000 mg total) by mouth 3 (three) times daily for 7 days. Please start taking this if you develop a rash where you  are having the pain as it could be shingles.    Follow Up: Aliene Beams, MD [address] [place] [address] [phone]     [facility] [address] [place] [address] [phone]       Kj Imbert, Canary Brim, MD 04/01/22 1257

## 2022-04-01 NOTE — ED Notes (Signed)
Pt reports LLQ pain x1 week, denies N/V/D or problems with urinating

## 2022-05-16 ENCOUNTER — Telehealth: Payer: BC Managed Care – PPO | Admitting: Emergency Medicine

## 2022-05-16 DIAGNOSIS — R197 Diarrhea, unspecified: Secondary | ICD-10-CM

## 2022-05-16 DIAGNOSIS — A084 Viral intestinal infection, unspecified: Secondary | ICD-10-CM | POA: Diagnosis not present

## 2022-05-16 NOTE — Progress Notes (Signed)
We are sorry that you are not feeling well.  Here is how we plan to help!  - Let me know if you need a note for work.  Based on what you have shared with me it looks like you have Acute Infectious Diarrhea.  Most cases of acute diarrhea are due to infections with virus and bacteria and are self-limited conditions lasting less than 14 days.  For your symptoms you may take Imodium 2 mg tablets that are over the counter at your local pharmacy. Take two tablet now and then one after each loose stool up to 6 a day.   Antibiotics are not needed for most people with diarrhea.    HOME CARE We recommend changing your diet to help with your symptoms for the next few days. Drink plenty of fluids that contain water salt and sugar. Sports drinks such as Gatorade may help.  You may try broths, soups, bananas, applesauce, soft breads, mashed potatoes or crackers.  You are considered infectious for as long as the diarrhea continues. Hand washing or use of alcohol based hand sanitizers is recommend. It is best to stay out of work or school until your symptoms stop.   GET HELP RIGHT AWAY If you have dark yellow colored urine or do not pass urine frequently you should drink more fluids.   If your symptoms worsen  If you feel like you are going to pass out (faint) You have a new problem  MAKE SURE YOU  Understand these instructions. Will watch your condition. Will get help right away if you are not doing well or get worse.  Thank you for choosing an e-visit.  Your e-visit answers were reviewed by a board certified advanced clinical practitioner to complete your personal care plan. Depending upon the condition, your plan could have included both over the counter or prescription medications.  Please review your pharmacy choice. Make sure the pharmacy is open so you can pick up prescription now. If there is a problem, you may contact your provider through [employer] and have the prescription  routed to another pharmacy.  Your safety is important to [place]. If you have drug allergies check your prescription carefully.   For the next 24 hours you can use MyChart to ask questions about today's visit, request a non-urgent call back, or ask for a work or school excuse. You will get an email in the next two days asking about your experience. I hope that your e-visit has been valuable and will speed your recovery.  I have spent 5 minutes in review of e-visit questionnaire, review and updating patient chart, medical decision making and response to patient.   Willeen Cass, PhD, FNP-BC

## 2022-06-02 DIAGNOSIS — Z0000 Encounter for general adult medical examination without abnormal findings: Secondary | ICD-10-CM | POA: Diagnosis not present

## 2022-06-02 DIAGNOSIS — E7800 Pure hypercholesterolemia, unspecified: Secondary | ICD-10-CM | POA: Diagnosis not present

## 2022-06-05 ENCOUNTER — Other Ambulatory Visit: Payer: Self-pay | Admitting: Physician Assistant

## 2022-06-05 DIAGNOSIS — E7800 Pure hypercholesterolemia, unspecified: Secondary | ICD-10-CM

## 2022-06-06 ENCOUNTER — Other Ambulatory Visit: Payer: Self-pay

## 2022-06-06 ENCOUNTER — Emergency Department (HOSPITAL_BASED_OUTPATIENT_CLINIC_OR_DEPARTMENT_OTHER)
Admission: EM | Admit: 2022-06-06 | Discharge: 2022-06-06 | Disposition: A | Discharge status: Home / Self Care | Payer: BC Managed Care – PPO | Attending: Emergency Medicine | Admitting: Emergency Medicine

## 2022-06-06 ENCOUNTER — Encounter (HOSPITAL_BASED_OUTPATIENT_CLINIC_OR_DEPARTMENT_OTHER): Payer: Self-pay | Admitting: Emergency Medicine

## 2022-06-06 DIAGNOSIS — L03211 Cellulitis of face: Secondary | ICD-10-CM

## 2022-06-06 MED ORDER — DOXYCYCLINE HYCLATE 100 MG PO CAPS: 100.0000 mg | ORAL_CAPSULE | Freq: Two times a day (BID) | ORAL | 0 refills | Status: DC

## 2022-06-06 NOTE — Discharge Instructions (Signed)
You were seen today for possible abscess on the face.  You have what appears to be a pimple and early cellulitis.  Take antibiotics as prescribed.  Apply antibiotic ointment.  If you note increasing redness, swelling, any new or worsening symptoms, you should be reevaluated.

## 2022-06-06 NOTE — ED Provider Notes (Signed)
[facility] Provider Note   CSN: [id number] Arrival date & time: 06/06/22  [id number]     History  Chief Complaint  Patient presents with   Abscess    Andrew DROMGOOLE is a 42 y.o. male.  HPI     This is a 42 year old male who presents with concern for abscess.  Patient reports he noted a cyst or bump on his right naris several days ago.  His wife tried to pop it and only got some clear liquid out.  Since that time he has noted it has gotten harder and more red.  Has had some drainage.  He has been using warm compresses.  No fevers or systemic symptoms.  Home Medications Prior to Admission medications   Medication Sig Start Date End Date Taking? Authorizing Provider  doxycycline (VIBRAMYCIN) 100 MG capsule Take 1 capsule (100 mg total) by mouth 2 (two) times daily. 06/06/22  Yes Brenda Samano, Barbette Hair, MD  allopurinol (ZYLOPRIM) 100 MG tablet Take 1 tablet (100 mg total) by mouth daily. 10/23/17   Wendie Agreste, MD  cyclobenzaprine (FLEXERIL) 10 MG tablet Take 1 tablet (10 mg total) by mouth 2 (two) times daily as needed for muscle spasms. 04/01/22   Tegeler, Gwenyth Allegra, MD  lidocaine (LIDODERM) 5 % Place 1 patch onto the skin daily. Remove & Discard patch within 12 hours or as directed by MD 04/01/22   Tegeler, Gwenyth Allegra, MD  naproxen (NAPROSYN) 500 MG tablet Take 1 tablet (500 mg total) by mouth 2 (two) times daily. 01/07/21   Raspet, Derry Skill, PA-C  atorvastatin (LIPITOR) 10 MG tablet Take 1 tablet (10 mg total) by mouth daily at 6 PM. 05/15/18 02/26/19  Nita Sells, MD  colchicine 0.6 MG tablet Take 1 tablet (0.6 mg total) by mouth daily as needed (with gout flare.). 10/01/17 02/26/19  Wendie Agreste, MD  fluticasone (FLONASE) 50 MCG/ACT nasal spray Place 2 sprays into both nostrils daily. 07/12/17 02/26/19  Wendie Agreste, MD      Allergies    Codeine    Review of Systems   Review of Systems  Constitutional:  Negative for  fever.  Skin:  Positive for color change.       Abscess  All other systems reviewed and are negative.   Physical Exam Updated Vital Signs Temp 98.3 F (36.8 C) (Oral)   Wt 96.2 kg   BMI 28.75 kg/m  Physical Exam Vitals and nursing note reviewed.  Constitutional:      Appearance: He is well-developed. He is not ill-appearing.  HENT:     Head: Normocephalic and atraumatic.     Nose:     Comments: Large pustule just to the right of the nasal bridge, crusting noted, adjacent induration without fluctuance, slight erythema Eyes:     Pupils: Pupils are equal, round, and reactive to light.  Cardiovascular:     Rate and Rhythm: Normal rate and regular rhythm.  Pulmonary:     Effort: Pulmonary effort is normal. No respiratory distress.  Abdominal:     Palpations: Abdomen is soft.  Musculoskeletal:     Cervical back: Neck supple.  Lymphadenopathy:     Cervical: No cervical adenopathy.  Skin:    General: Skin is warm and dry.  Neurological:     Mental Status: He is alert and oriented to person, place, and time.  Psychiatric:        Mood and Affect: Mood normal.  ED Results / Procedures / Treatments   Labs (all labs ordered are listed, but only abnormal results are displayed) Labs Reviewed - No data to display  EKG None  Radiology No results found.  Procedures Procedures    Medications Ordered in ED Medications - No data to display  ED Course/ Medical Decision Making/ A&P                             Medical Decision Making Risk Prescription drug management.   This patient presents to the ED for concern of abscess, this involves an extensive number of treatment options, and is a complaint that carries with it a high risk of complications and morbidity.  I considered the following differential and admission for this acute, potentially life threatening condition.  The differential diagnosis includes abscess, cellulitis, cyst  MDM:    This is a 42 year old  male who presents with pustule to the right naris.  He is nontoxic and vital signs are reassuring.  He has what appears to be a pustule just adjacent to the nasal bridge.  He has some crusting likely reflecting drainage.  There is an adjacent induration but no fluctuance.  I did unroofed the pustule and was only able to express a small amount of blood and clear fluid.  No purulence.  Given adjacent induration and erythema, will treat for mild cellulitis with doxycycline.  Otherwise I recommended that he apply antibiotic ointment and avoid further manipulation.  He is to return for any new or worsening symptoms.  (Labs, imaging, consults)  Labs: I Ordered, and personally interpreted labs.  The pertinent results include: None  Imaging Studies ordered: I ordered imaging studies including none I independently visualized and interpreted imaging. I agree with the radiologist interpretation  Additional history obtained from chart review.  External records from outside source obtained and reviewed including prior evaluations  Cardiac Monitoring: The patient was not maintained on a cardiac monitor.  If on the cardiac monitor, I personally viewed and interpreted the cardiac monitored which showed an underlying rhythm of: N/A  Reevaluation: After the interventions noted above, I reevaluated the patient and found that they have :stayed the same  Social Determinants of Health:  lives independently  Disposition: Discharge  Co morbidities that complicate the patient evaluation  Past Medical History:  Diagnosis Date   Abscess    Hemorrhoids    Rectal pain    Rheumatic fever    as a child     Medicines Meds ordered this encounter  Medications   doxycycline (VIBRAMYCIN) 100 MG capsule    Sig: Take 1 capsule (100 mg total) by mouth 2 (two) times daily.    Dispense:  10 capsule    Refill:  0    I have reviewed the patients home medicines and have made adjustments as needed  Problem List /  ED Course: Problem List Items Addressed This Visit   None Visit Diagnoses     Facial cellulitis    -  Primary                   Final Clinical Impression(s) / ED Diagnoses Final diagnoses:  Facial cellulitis    Rx / DC Orders ED Discharge Orders          Ordered    doxycycline (VIBRAMYCIN) 100 MG capsule  2 times daily        06/06/22 [id number]  Merryl Hacker, MD 06/06/22 [phone]

## 2022-06-06 NOTE — ED Triage Notes (Signed)
Pt in with bump to R nare, states it first developed with a whitehead on Saturday and his wife popped it. Tonight he is noticing increased redness and swelling around the site. Denies any fevers

## 2022-06-30 ENCOUNTER — Ambulatory Visit
Admission: RE | Admit: 2022-06-30 | Discharge: 2022-06-30 | Disposition: A | Discharge status: Home / Self Care | Payer: No Typology Code available for payment source | Source: Ambulatory Visit | Attending: Physician Assistant | Admitting: Physician Assistant

## 2022-06-30 DIAGNOSIS — E7800 Pure hypercholesterolemia, unspecified: Secondary | ICD-10-CM

## 2022-07-20 ENCOUNTER — Telehealth: Payer: BC Managed Care – PPO | Admitting: Nurse Practitioner

## 2022-07-20 DIAGNOSIS — J0140 Acute pansinusitis, unspecified: Secondary | ICD-10-CM

## 2022-07-20 MED ORDER — AMOXICILLIN-POT CLAVULANATE 875-125 MG PO TABS: 1.0000 | ORAL_TABLET | Freq: Two times a day (BID) | ORAL | 0 refills | Status: AC

## 2022-07-20 MED ORDER — FLUTICASONE PROPIONATE 50 MCG/ACT NA SUSP: 2.0000 | Freq: Every day | NASAL | 6 refills | Status: DC

## 2022-07-20 NOTE — Progress Notes (Signed)
E-Visit for Sinus Problems  We are sorry that you are not feeling well.  Here is how we plan to help!  Based on what you have shared with me it looks like you have sinusitis.  Sinusitis is inflammation and infection in the sinus cavities of the head.  Based on your presentation I believe you most likely have Acute Bacterial Sinusitis.  This is an infection caused by bacteria and is treated with antibiotics. I have prescribed Augmentin /125mg  one tablet twice daily with food, for 7 days. You may use an oral decongestant such as Mucinex D or if you have glaucoma or high blood pressure use plain Mucinex. Saline nasal spray help and can safely be used as often as needed for congestion.  If you develop worsening sinus pain, fever or notice severe headache and vision changes, or if symptoms are not better after completion of antibiotic, please schedule an appointment with a health care provider.    We would also recommend starting a nasal spray for support  Meds ordered this encounter  Medications   amoxicillin-clavulanate (AUGMENTIN) 875-125 MG tablet    Sig: Take 1 tablet by mouth 2 (two) times daily for 7 days. Take with food    Dispense:  14 tablet    Refill:  0   fluticasone (FLONASE) 50 MCG/ACT nasal spray    Sig: Place 2 sprays into both nostrils daily.    Dispense:  16 g    Refill:  6     Sinus infections are not as easily transmitted as other respiratory infection, however we still recommend that you avoid close contact with loved ones, especially the very young and elderly.  Remember to wash your hands thoroughly throughout the day as this is the number one way to prevent the spread of infection!  Home Care: Only take medications as instructed by your medical team. Complete the entire course of an antibiotic. Do not take these medications with alcohol. A steam or ultrasonic humidifier can help congestion.  You can place a towel over your head and breathe in the steam from hot water  coming from a faucet. Avoid close contacts especially the very young and the elderly. Cover your mouth when you cough or sneeze. Always remember to wash your hands.  Get Help Right Away If: You develop worsening fever or sinus pain. You develop a severe head ache or visual changes. Your symptoms persist after you have completed your treatment plan.  Make sure you Understand these instructions. Will watch your condition. Will get help right away if you are not doing well or get worse.  Thank you for choosing an e-visit.  Your e-visit answers were reviewed by a board certified advanced clinical practitioner to complete your personal care plan. Depending upon the condition, your plan could have included both over the counter or prescription medications.  Please review your pharmacy choice. Make sure the pharmacy is open so you can pick up prescription now. If there is a problem, you may contact your provider through [employer] and have the prescription routed to another pharmacy.  Your safety is important to [place]. If you have drug allergies check your prescription carefully.   For the next 24 hours you can use MyChart to ask questions about today's visit, request a non-urgent call back, or ask for a work or school excuse. You will get an email in the next two days asking about your experience. I hope that your e-visit has been valuable and will speed your  recovery.  I spent approximately 5 minutes reviewing the patient's history, current symptoms and coordinating their care today.

## 2022-10-17 ENCOUNTER — Other Ambulatory Visit (HOSPITAL_BASED_OUTPATIENT_CLINIC_OR_DEPARTMENT_OTHER): Payer: Self-pay

## 2022-10-17 ENCOUNTER — Emergency Department (HOSPITAL_BASED_OUTPATIENT_CLINIC_OR_DEPARTMENT_OTHER): Payer: BC Managed Care – PPO

## 2022-10-17 ENCOUNTER — Emergency Department (HOSPITAL_BASED_OUTPATIENT_CLINIC_OR_DEPARTMENT_OTHER)
Admission: EM | Admit: 2022-10-17 | Discharge: 2022-10-17 | Disposition: A | Discharge status: Home / Self Care | Payer: BC Managed Care – PPO | Attending: Emergency Medicine | Admitting: Emergency Medicine

## 2022-10-17 ENCOUNTER — Encounter (HOSPITAL_BASED_OUTPATIENT_CLINIC_OR_DEPARTMENT_OTHER): Payer: Self-pay | Admitting: Emergency Medicine

## 2022-10-17 ENCOUNTER — Other Ambulatory Visit: Payer: Self-pay

## 2022-10-17 DIAGNOSIS — R519 Headache, unspecified: Secondary | ICD-10-CM | POA: Insufficient documentation

## 2022-10-17 DIAGNOSIS — S39012A Strain of muscle, fascia and tendon of lower back, initial encounter: Secondary | ICD-10-CM | POA: Diagnosis not present

## 2022-10-17 DIAGNOSIS — Y92410 Unspecified street and highway as the place of occurrence of the external cause: Secondary | ICD-10-CM | POA: Insufficient documentation

## 2022-10-17 DIAGNOSIS — S161XXA Strain of muscle, fascia and tendon at neck level, initial encounter: Secondary | ICD-10-CM | POA: Diagnosis not present

## 2022-10-17 DIAGNOSIS — S139XXA Sprain of joints and ligaments of unspecified parts of neck, initial encounter: Secondary | ICD-10-CM | POA: Diagnosis not present

## 2022-10-17 DIAGNOSIS — S0990XA Unspecified injury of head, initial encounter: Secondary | ICD-10-CM | POA: Diagnosis not present

## 2022-10-17 DIAGNOSIS — M542 Cervicalgia: Secondary | ICD-10-CM | POA: Diagnosis not present

## 2022-10-17 DIAGNOSIS — S199XXA Unspecified injury of neck, initial encounter: Secondary | ICD-10-CM | POA: Diagnosis not present

## 2022-10-17 DIAGNOSIS — S3992XA Unspecified injury of lower back, initial encounter: Secondary | ICD-10-CM | POA: Diagnosis not present

## 2022-10-17 HISTORY — DX: Gout, unspecified: M10.9

## 2022-10-17 MED ORDER — CYCLOBENZAPRINE HCL 10 MG PO TABS
10.0000 mg | ORAL_TABLET | Freq: Three times a day (TID) | ORAL | 0 refills | Status: AC | PRN
  Filled 2022-10-17: qty 9, 3d supply, fill #0

## 2022-10-17 MED ORDER — CYCLOBENZAPRINE HCL 10 MG PO TABS
10.0000 mg | ORAL_TABLET | Freq: Once | ORAL | Status: AC
  Administered 2022-10-17: 10 mg via ORAL
  Filled 2022-10-17: qty 1

## 2022-10-17 MED ORDER — ONDANSETRON 4 MG PO TBDP
4.0000 mg | ORAL_TABLET | Freq: Once | ORAL | Status: AC
  Administered 2022-10-17: 4 mg via ORAL
  Filled 2022-10-17: qty 1

## 2022-10-17 MED ORDER — LIDOCAINE 5 % EX PTCH
2.0000 | MEDICATED_PATCH | CUTANEOUS | 0 refills | Status: AC
  Filled 2022-10-17: qty 6, 3d supply, fill #0

## 2022-10-17 MED ORDER — KETOROLAC TROMETHAMINE 15 MG/ML IJ SOLN
15.0000 mg | Freq: Once | INTRAMUSCULAR | Status: AC
  Administered 2022-10-17: 15 mg via INTRAMUSCULAR
  Filled 2022-10-17: qty 1

## 2022-10-17 MED ORDER — IBUPROFEN 600 MG PO TABS
600.0000 mg | ORAL_TABLET | Freq: Three times a day (TID) | ORAL | 0 refills | Status: AC | PRN
  Filled 2022-10-17: qty 9, 3d supply, fill #0

## 2022-10-17 MED ORDER — KETOROLAC TROMETHAMINE 15 MG/ML IJ SOLN
INTRAMUSCULAR | Status: AC
  Filled 2022-10-17: qty 1

## 2022-10-17 MED ORDER — OXYCODONE-ACETAMINOPHEN 5-325 MG PO TABS
2.0000 | ORAL_TABLET | Freq: Once | ORAL | Status: AC
  Administered 2022-10-17: 2 via ORAL
  Filled 2022-10-17: qty 2

## 2022-10-17 NOTE — ED Notes (Signed)
Dc instructions reviewed with patient. Patient voiced understanding. Dc with belongings.  °
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
## 2022-10-17 NOTE — ED Notes (Signed)
Pt given snack before takingpain  medication, hasn't eaten all day.
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
## 2023-02-28 ENCOUNTER — Telehealth: Payer: BC Managed Care – PPO | Admitting: Physician Assistant

## 2023-02-28 DIAGNOSIS — J019 Acute sinusitis, unspecified: Secondary | ICD-10-CM | POA: Diagnosis not present

## 2023-02-28 DIAGNOSIS — B9689 Other specified bacterial agents as the cause of diseases classified elsewhere: Secondary | ICD-10-CM | POA: Diagnosis not present

## 2023-02-28 MED ORDER — IPRATROPIUM BROMIDE 0.03 % NA SOLN: 2.0000 | Freq: Two times a day (BID) | NASAL | 0 refills | Status: DC

## 2023-02-28 MED ORDER — AMOXICILLIN-POT CLAVULANATE 875-125 MG PO TABS: 1.0000 | ORAL_TABLET | Freq: Two times a day (BID) | ORAL | 0 refills | Status: DC

## 2023-02-28 NOTE — Progress Notes (Signed)
E-Visit for Sinus Problems  We are sorry that you are not feeling well.  Here is how we plan to help!  Based on what you have shared with me it looks like you have sinusitis.  Sinusitis is inflammation and infection in the sinus cavities of the head.  Based on your presentation I believe you most likely have Acute Bacterial Sinusitis.  This is an infection caused by bacteria and is treated with antibiotics. I have prescribed Augmentin 875mg /125mg  one tablet twice daily with food, for 7 days. I have also sent in a nasal steroid spray, Atrovent, to use as directed. You may use an oral decongestant such as Mucinex D or if you have glaucoma or high blood pressure use plain Mucinex. Saline nasal spray help and can safely be used as often as needed for congestion.  If you develop worsening sinus pain, fever or notice severe headache and vision changes, or if symptoms are not better after completion of antibiotic, please schedule an appointment with a health care provider.    Sinus infections are not as easily transmitted as other respiratory infection, however we still recommend that you avoid close contact with loved ones, especially the very young and elderly.  Remember to wash your hands thoroughly throughout the day as this is the number one way to prevent the spread of infection!  Home Care: Only take medications as instructed by your medical team. Complete the entire course of an antibiotic. Do not take these medications with alcohol. A steam or ultrasonic humidifier can help congestion.  You can place a towel over your head and breathe in the steam from hot water coming from a faucet. Avoid close contacts especially the very young and the elderly. Cover your mouth when you cough or sneeze. Always remember to wash your hands.  Get Help Right Away If: You develop worsening fever or sinus pain. You develop a severe head ache or visual changes. Your symptoms persist after you have completed your  treatment plan.  Make sure you Understand these instructions. Will watch your condition. Will get help right away if you are not doing well or get worse.  Thank you for choosing an e-visit.  Your e-visit answers were reviewed by a board certified advanced clinical practitioner to complete your personal care plan. Depending upon the condition, your plan could have included both over the counter or prescription medications.  Please review your pharmacy choice. Make sure the pharmacy is open so you can pick up prescription now. If there is a problem, you may contact your provider through [employer] and have the prescription routed to another pharmacy.  Your safety is important to [place]. If you have drug allergies check your prescription carefully.   For the next 24 hours you can use MyChart to ask questions about today's visit, request a non-urgent call back, or ask for a work or school excuse. You will get an email in the next two days asking about your experience. I hope that your e-visit has been valuable and will speed your recovery.

## 2023-02-28 NOTE — Progress Notes (Signed)
I have spent 5 minutes in review of e-visit questionnaire, review and updating patient chart, medical decision making and response to patient.   Mia Milan Cody Jacklynn Dehaas, PA-C    

## 2023-03-13 ENCOUNTER — Emergency Department (HOSPITAL_BASED_OUTPATIENT_CLINIC_OR_DEPARTMENT_OTHER): Payer: BC Managed Care – PPO | Admitting: Radiology

## 2023-03-13 ENCOUNTER — Encounter (HOSPITAL_BASED_OUTPATIENT_CLINIC_OR_DEPARTMENT_OTHER): Payer: Self-pay | Admitting: Emergency Medicine

## 2023-03-13 ENCOUNTER — Other Ambulatory Visit: Payer: Self-pay

## 2023-03-13 DIAGNOSIS — Z1152 Encounter for screening for COVID-19: Secondary | ICD-10-CM | POA: Insufficient documentation

## 2023-03-13 DIAGNOSIS — F17290 Nicotine dependence, other tobacco product, uncomplicated: Secondary | ICD-10-CM | POA: Diagnosis not present

## 2023-03-13 DIAGNOSIS — R0789 Other chest pain: Secondary | ICD-10-CM | POA: Diagnosis not present

## 2023-03-13 DIAGNOSIS — R079 Chest pain, unspecified: Secondary | ICD-10-CM | POA: Diagnosis not present

## 2023-03-13 DIAGNOSIS — R0602 Shortness of breath: Secondary | ICD-10-CM | POA: Diagnosis not present

## 2023-03-13 NOTE — ED Triage Notes (Signed)
Patient presents with right sided chest pain and intermittent sob x few days. Reports recently completing abx for URI, however still having pain.

## 2023-03-14 ENCOUNTER — Emergency Department (HOSPITAL_BASED_OUTPATIENT_CLINIC_OR_DEPARTMENT_OTHER)
Admission: EM | Admit: 2023-03-14 | Discharge: 2023-03-14 | Disposition: A | Discharge status: Home / Self Care | Payer: BC Managed Care – PPO | Attending: Emergency Medicine | Admitting: Emergency Medicine

## 2023-03-14 DIAGNOSIS — R079 Chest pain, unspecified: Secondary | ICD-10-CM

## 2023-03-14 LAB — CBC
HCT: 41.8 % (ref 39.0–52.0)
Hemoglobin: 13.8 g/dL (ref 13.0–17.0)
MCH: 31 pg (ref 26.0–34.0)
MCHC: 33 g/dL (ref 30.0–36.0)
MCV: 93.9 fL (ref 80.0–100.0)
Platelets: 237 10*3/uL (ref 150–400)
RBC: 4.45 MIL/uL (ref 4.22–5.81)
RDW: 13.2 % (ref 11.5–15.5)
WBC: 3.3 10*3/uL — ABNORMAL LOW (ref 4.0–10.5)
nRBC: 0 % (ref 0.0–0.2)

## 2023-03-14 LAB — BASIC METABOLIC PANEL
Anion gap: 8 (ref 5–15)
BUN: 17 mg/dL (ref 6–20)
CO2: 26 mmol/L (ref 22–32)
Calcium: 9.8 mg/dL (ref 8.9–10.3)
Chloride: 104 mmol/L (ref 98–111)
Creatinine, Ser: 1.24 mg/dL (ref 0.61–1.24)
GFR, Estimated: >60 mL/min (ref >60)
Glucose, Bld: 105 mg/dL — ABNORMAL HIGH (ref 70–99)
Potassium: 3.9 mmol/L (ref 3.5–5.1)
Sodium: 138 mmol/L (ref 135–145)

## 2023-03-14 LAB — RESP PANEL BY RT-PCR (RSV, FLU A&B, COVID)  RVPGX2
Influenza A by PCR: NEGATIVE
Influenza B by PCR: NEGATIVE
Resp Syncytial Virus by PCR: NEGATIVE
SARS Coronavirus 2 by RT PCR: NEGATIVE

## 2023-03-14 LAB — TROPONIN I (HIGH SENSITIVITY)
Troponin I (High Sensitivity): <2 ng/L (ref <18)
Troponin I (High Sensitivity): <2 ng/L (ref <18)

## 2023-03-14 MED ORDER — NAPROXEN 500 MG PO TABS: 500.0000 mg | ORAL_TABLET | Freq: Two times a day (BID) | ORAL | 0 refills | Status: DC

## 2023-03-14 NOTE — Discharge Instructions (Addendum)
You were evaluated in the Emergency Department and after careful evaluation, we did not find any emergent condition requiring admission or further testing in the hospital.  Your exam/testing today is overall reassuring.  No signs of heart damage or heart attack.  Pain likely related to chest wall muscular strain or spasm.  Use the Naprosyn twice daily for pain, follow-up with your regular doctor.  Please return to the Emergency Department if you experience any worsening of your condition.   Thank you for allowing [place] to be a part of your care.

## 2023-03-14 NOTE — ED Provider Notes (Signed)
[facility]GENC[facility] Emergency Department Provider Note MRN:  [id number]  Arrival date & time: 03/14/23     Chief Complaint   Chest Pain and Nasal Congestion   History of Present Illness   Andrew Foley is a 42 y.o. year-old male with no pertinent past medical history presenting to the ED with chief complaint of chest pain.  1 or 2 weeks ago was having a lot of nasal congestion and cough.  Had a very persistent cough that is finally getting better.  Over the past few days having some right sided chest pain.  Worse with certain movements of the torso and right arm.  Denies dizziness or diaphoresis, no nausea or vomiting, no trouble breathing.  No recent leg pain or swelling.  Review of Systems  A thorough review of systems was obtained and all systems are negative except as noted in the HPI and PMH.   Patient's Health History    Past Medical History:  Diagnosis Date   Abscess    Gout    Hemorrhoids    Rectal pain    Rheumatic fever    as a child    Past Surgical History:  Procedure Laterality Date   ADENOIDECTOMY     ANAL EXAMINATION UNDER ANESTHESIA  06/01/2017   Dr Sheliah Hatch.  Brisk anal bleeding - cautery & suture control   INCISION AND DRAINAGE ABSCESS ANAL  05/25/2017   [place].  Dr Albesa Seen.  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, wide local excision of perirectal abscess cavity   PILONIDAL CYST EXCISION      Family History  Problem Relation Age of Onset   Breast cancer Mother    Gout Mother    Atrial fibrillation Mother    Hyperlipidemia Father    Hyperlipidemia Brother     Social History   Socioeconomic History   Marital status: Married    Spouse name: [name]   Number of children: 3   Years of education: Not on file   Highest education level: High school graduate  Occupational History   Not on file  Tobacco Use   Smoking status: Light Smoker    Types: Cigars   Smokeless tobacco: Never   Tobacco comments:    2  cigars a week  Vaping Use   Vaping status: Never Used  Substance and Sexual Activity   Alcohol use: Not Currently    Alcohol/week: 20.0 standard drinks of alcohol    Types: 20 Cans of beer per week   Drug use: No   Sexual activity: Yes    Birth control/protection: None  Other Topics Concern   Not on file  Social History Narrative   Not on file   Social Determinants of Health   Financial Resource Strain: Medium Risk (06/02/2017)   Overall Financial Resource Strain (CARDIA)    Difficulty of Paying Living Expenses: Somewhat hard  Food Insecurity: Food Insecurity Present (06/02/2017)   Hunger Vital Sign    Worried About Running Out of Food in the Last Year: Sometimes true    Ran Out of Food in the Last Year: Never true  Transportation Needs: No Transportation Needs (06/02/2017)   PRAPARE - Administrator, Civil Service (Medical): No    Lack of Transportation (Non-Medical): No  Physical Activity: Sufficiently Active (06/02/2017)   Exercise Vital Sign    Days of Exercise per Week: 7 days    Minutes of Exercise per Session: 150+ min  Stress: No Stress Concern  Present (06/02/2017)   [employer] of Occupational Health - Occupational Stress Questionnaire    Feeling of Stress : Not at all  Social Connections: Socially Integrated (06/02/2017)   Social Connection and Isolation Panel [NHANES]    Frequency of Communication with Friends and Family: More than three times a week    Frequency of Social Gatherings with Friends and Family: Twice a week    Attends Religious Services: More than 4 times per year    Active Member of Clubs or Organizations: Yes    Attends Banker Meetings: More than 4 times per year    Marital Status: Married  Catering manager Violence: Unknown (06/02/2017)   Humiliation, Afraid, Rape, and Kick questionnaire    Fear of Current or Ex-Partner: Patient declined    Emotionally Abused: Patient declined    Physically Abused: Patient declined     Sexually Abused: Patient declined     Physical Exam   Vitals:   03/14/23 0058 03/14/23 0330  BP: (!) 135/103 (!) 119/93  Pulse: (!) 58 (!) 50  Resp: 16 16  Temp:    SpO2: 100% 100%    CONSTITUTIONAL: Well-appearing, NAD NEURO/PSYCH:  Alert and oriented x 3, no focal deficits EYES:  eyes equal and reactive ENT/NECK:  no LAD, no JVD CARDIO: Regular rate, well-perfused, normal S1 and S2 PULM:  CTAB no wheezing or rhonchi GI/GU:  non-distended, non-tender MSK/SPINE:  No gross deformities, no edema SKIN:  no rash, atraumatic   *Additional and/or pertinent findings included in MDM below  Diagnostic and Interventional Summary    EKG Interpretation Date/Time:  Tuesday March 13 2023 22:55:47 EST Ventricular Rate:  66 PR Interval:  178 QRS Duration:  86 QT Interval:  424 QTC Calculation: 444 R Axis:   90  Text Interpretation: Normal sinus rhythm Possible Left atrial enlargement Rightward axis Borderline ECG When compared with ECG of 07-Mar-2019 10:45, Nonspecific T wave abnormality no longer evident in Lateral leads Confirmed by Kennis Carina [phone]) on 03/13/2023 11:08:41 PM       Labs Reviewed  CBC - Abnormal; Notable for the following components:      Result Value   WBC 3.3 (*)    All other components within normal limits  BASIC METABOLIC PANEL - Abnormal; Notable for the following components:   Glucose, Bld 105 (*)    All other components within normal limits  RESP PANEL BY RT-PCR (RSV, FLU A&B, COVID)  RVPGX2  TROPONIN I (HIGH SENSITIVITY)  TROPONIN I (HIGH SENSITIVITY)    DG Chest 2 View  Final Result      Medications - No data to display   Procedures  /  Critical Care Procedures  ED Course and Medical Decision Making  Initial Impression and Ddx Favoring MSK related chest pain, little to no cardiovascular risk factors.  Vitals reassuring.  No signs of DVT on exam, highly doubt PE.  ACS is of course considered.  Past medical/surgical history that  increases complexity of ED encounter: None  Interpretation of Diagnostics I personally reviewed the EKG and my interpretation is as follows: Sinus rhythm without concerning ischemic findings  Labs reassuring with no significant blood count or electrolyte disturbance.  Troponin negative x 2  Patient Reassessment and Ultimate Disposition/Management     Discharge  Patient management required discussion with the following services or consulting groups:  None  Complexity of Problems Addressed Acute illness or injury that poses threat of life of bodily function  Additional Data Reviewed and Analyzed Further  history obtained from: Prior labs/imaging results  Additional Factors Impacting ED Encounter Risk Prescriptions  Elmer Sow. Pilar Plate, MD [facility] Emergency Medicine [facility] [email or web address]  Final Clinical Impressions(s) / ED Diagnoses     ICD-10-CM   1. Chest pain, unspecified type  R07.9       ED Discharge Orders          Ordered    naproxen (NAPROSYN) 500 MG tablet  2 times daily        03/14/23 0402             Discharge Instructions Discussed with and Provided to Patient:    Discharge Instructions      You were evaluated in the Emergency Department and after careful evaluation, we did not find any emergent condition requiring admission or further testing in the hospital.  Your exam/testing today is overall reassuring.  No signs of heart damage or heart attack.  Pain likely related to chest wall muscular strain or spasm.  Use the Naprosyn twice daily for pain, follow-up with your regular doctor.  Please return to the Emergency Department if you experience any worsening of your condition.   Thank you for allowing [place] to be a part of your care.      Sabas Sous, MD 03/14/23 [phone]

## 2023-08-16 ENCOUNTER — Telehealth: Admitting: Physician Assistant

## 2023-08-16 DIAGNOSIS — L03019 Cellulitis of unspecified finger: Secondary | ICD-10-CM | POA: Diagnosis not present

## 2023-08-16 MED ORDER — CEPHALEXIN 500 MG PO CAPS: 500.0000 mg | ORAL_CAPSULE | Freq: Four times a day (QID) | ORAL | 0 refills | Status: AC

## 2023-08-16 NOTE — Progress Notes (Signed)
 E-Visit for Cellulitis  We are sorry that you are not feeling well. Here is how we plan to help!  Based on what you shared with me it looks like you have cellulitis/paronychia.  Cellulitis looks like areas of skin redness, swelling, and warmth; it develops as a result of bacteria entering under the skin. Paronychia is the term for when there is infection around the nail which can occur from trauma, hangnails, etc.  Fever can be present. Cellulitis is almost always on one side of a body, and the lower limbs are the most common site of involvement.   I have prescribed:  Keflex 500mg  take one by mouth four times a day for 5 days  HOME CARE:  Take your medications as ordered and take all of them, even if the skin irritation appears to be healing.   GET HELP RIGHT AWAY IF:  Symptoms that don't begin to go away within 48 hours. Severe redness persists or worsens If the area turns color, spreads or swells. If it blisters and opens, develops yellow-brown crust or bleeds. You develop a fever or chills. If the pain increases or becomes unbearable.  Are unable to keep fluids and food down.  MAKE SURE YOU   Understand these instructions. Will watch your condition. Will get help right away if you are not doing well or get worse.  Thank you for choosing an e-visit.  Your e-visit answers were reviewed by a board certified advanced clinical practitioner to complete your personal care plan. Depending upon the condition, your plan could have included both over the counter or prescription medications.  Please review your pharmacy choice. Make sure the pharmacy is open so you can pick up prescription now. If there is a problem, you may contact your provider through [employer] and have the prescription routed to another pharmacy.  Your safety is important to us . If you have drug allergies check your prescription carefully.   For the next 24 hours you can use MyChart to ask questions about today's  visit, request a non-urgent call back, or ask for a work or school excuse. You will get an email in the next two days asking about your experience. I hope that your e-visit has been valuable and will speed your recovery.

## 2023-08-16 NOTE — Progress Notes (Signed)
 I have spent 5 minutes in review of e-visit questionnaire, review and updating patient chart, medical decision making and response to patient.   Piedad Climes, PA-C

## 2023-08-27 IMAGING — DX DG ANKLE COMPLETE 3+V*L*
3 series · 3 of 3 positions shown · non-contrast
Comparison: None.

CLINICAL DATA: Injury to left ankle while playing with son
weeks ago. Worse yesterday at work.

EXAM:
LEFT ANKLE COMPLETE - 3+ VIEW

[ankle ap]
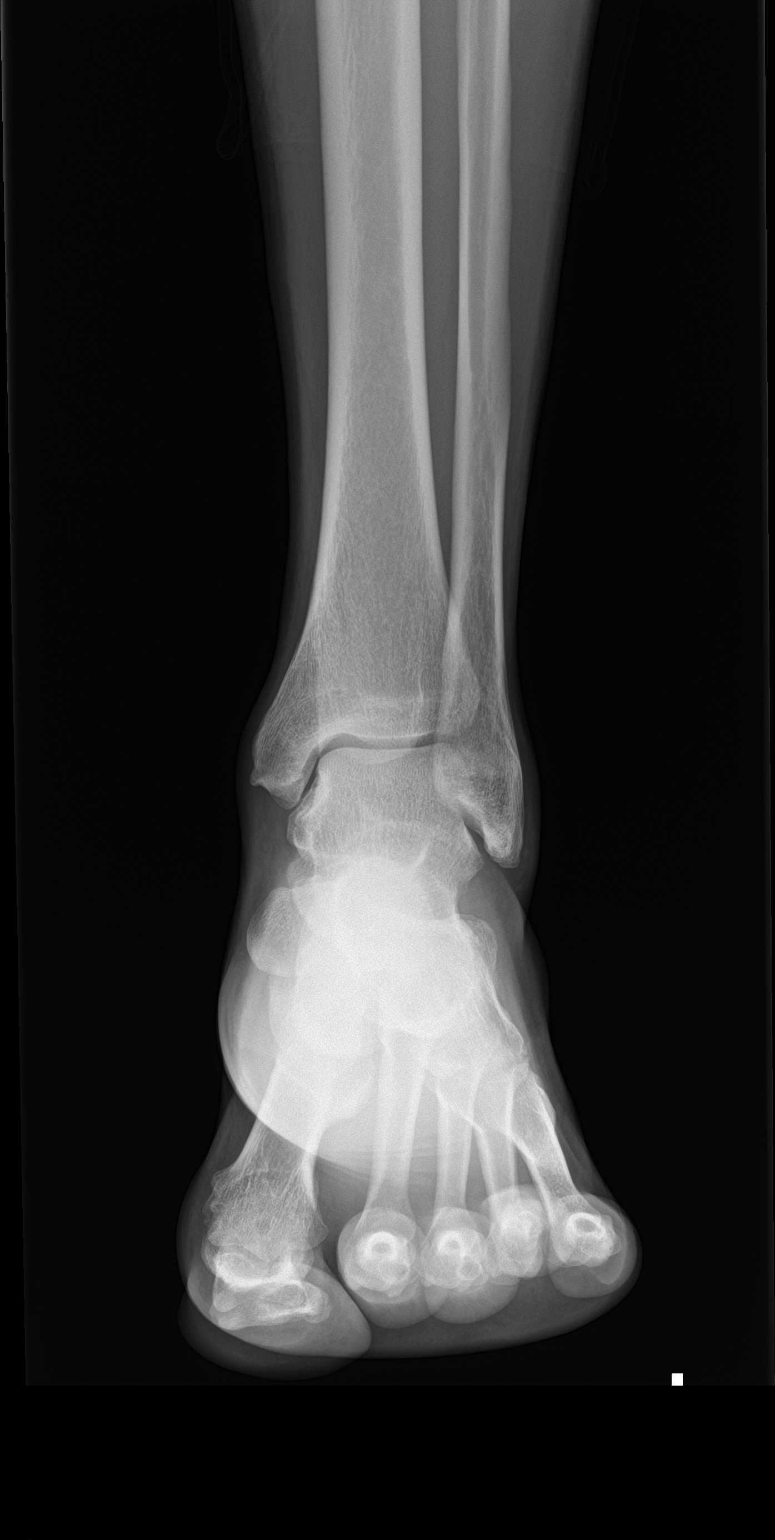

[ankle obl]
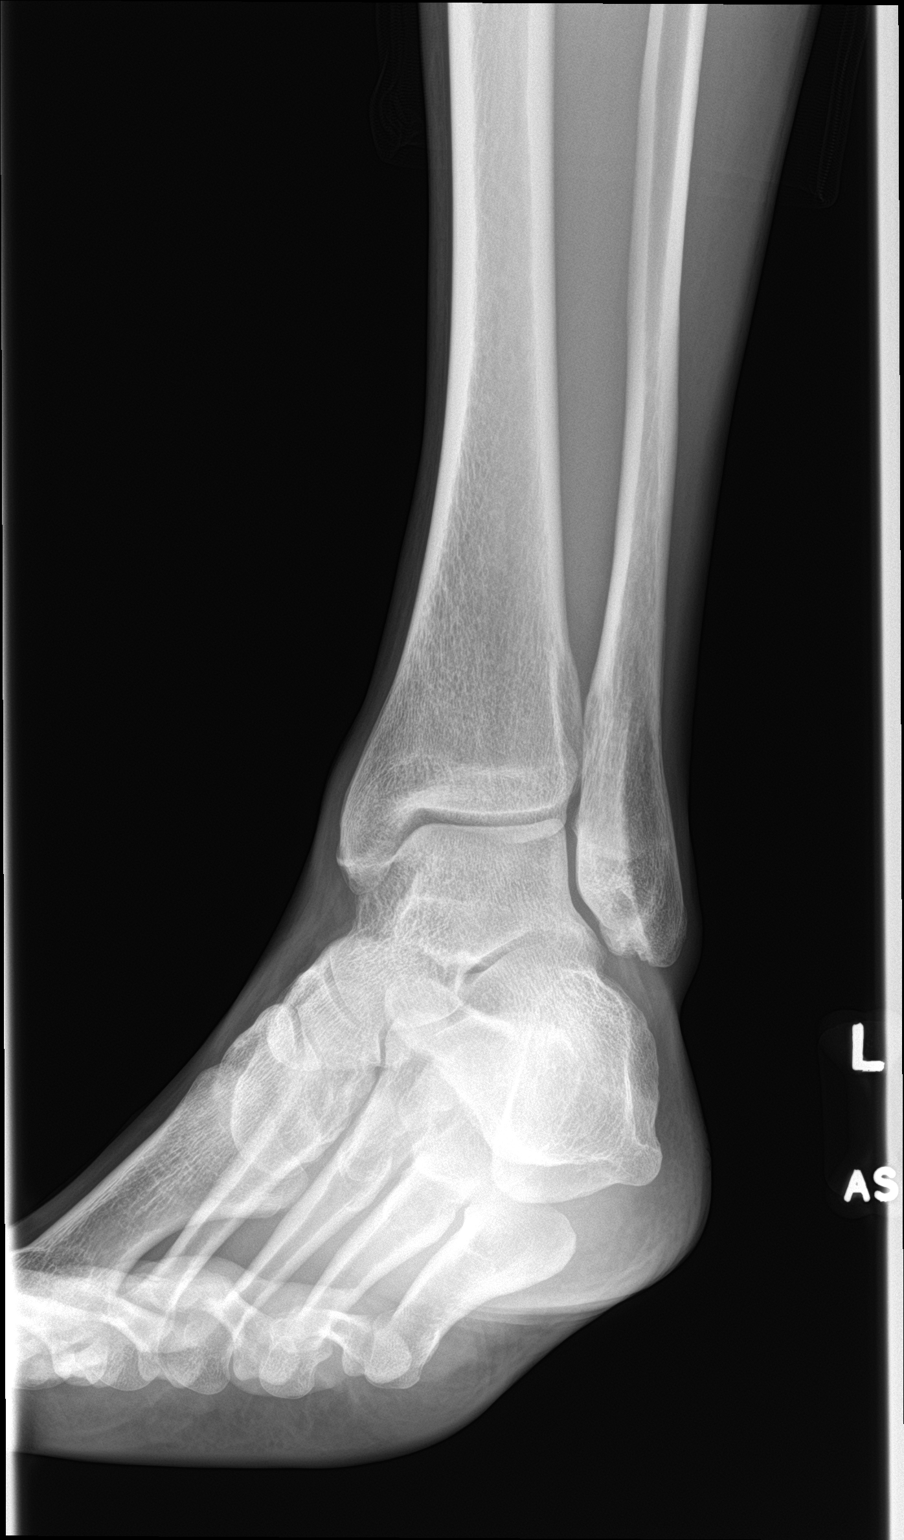

[ankle lat]
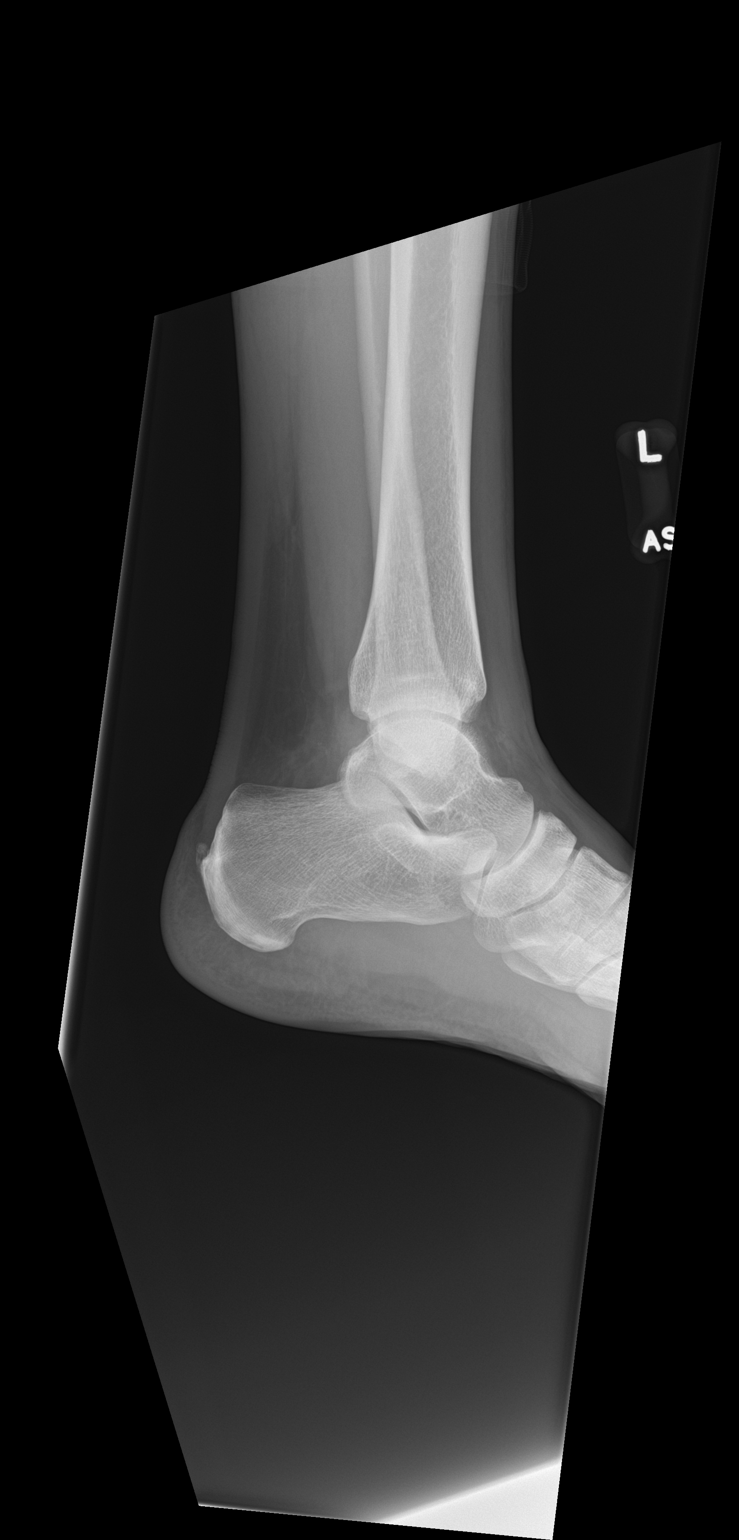

[3 of 3 positions shown; findings below may reference images not displayed]

FINDINGS: There is no evidence of fracture or dislocation. Small posterior
ankle joint effusion. Ankle mortise is intact. There is no evidence
of arthropathy or other focal bone abnormality. Tiny posterior
calcaneal enthesophyte.
IMPRESSION: No fracture or dislocation.  Small posterior ankle joint effusion.

## 2023-12-07 ENCOUNTER — Telehealth: Admitting: Family Medicine

## 2023-12-07 DIAGNOSIS — B9689 Other specified bacterial agents as the cause of diseases classified elsewhere: Secondary | ICD-10-CM | POA: Diagnosis not present

## 2023-12-07 DIAGNOSIS — J019 Acute sinusitis, unspecified: Secondary | ICD-10-CM

## 2023-12-07 MED ORDER — AMOXICILLIN-POT CLAVULANATE 875-125 MG PO TABS: 1.0000 | ORAL_TABLET | Freq: Two times a day (BID) | ORAL | 0 refills | Status: DC

## 2023-12-07 NOTE — Progress Notes (Signed)

## 2024-02-19 ENCOUNTER — Telehealth: Admitting: Physician Assistant

## 2024-02-19 DIAGNOSIS — J019 Acute sinusitis, unspecified: Secondary | ICD-10-CM

## 2024-02-19 MED ORDER — PREDNISONE 10 MG (21) PO TBPK: ORAL_TABLET | ORAL | 0 refills | Status: AC

## 2024-02-19 NOTE — Progress Notes (Unsigned)
 We are sorry that you are not feeling well.  Here is how we plan to help!  Based on what you have shared with me it looks like you have sinusitis.  Sinusitis is inflammation and infection in the sinus cavities of the head.  Based on your presentation I believe you most likely have Acute Viral Sinusitis.This is an infection most likely caused by a virus. There is not specific treatment for viral sinusitis other than to help you with the symptoms until the infection runs its course.  You may use an oral decongestant such as Mucinex D or if you have glaucoma or high blood pressure use plain Mucinex. Saline nasal spray help and can safely be used as often as needed for congestion, I have prescribed a prednisone  pack to take as directed.  Some authorities believe that zinc sprays or the use of Echinacea may shorten the course of your symptoms.  Sinus infections are not as easily transmitted as other respiratory infection, however we still recommend that you avoid close contact with loved ones, especially the very young and elderly.  Remember to wash your hands thoroughly throughout the day as this is the number one way to prevent the spread of infection!  Home Care: Only take medications as instructed by your medical team. Do not take these medications with alcohol. A steam or ultrasonic humidifier can help congestion.  You can place a towel over your head and breathe in the steam from hot water coming from a faucet. Avoid close contacts especially the very young and the elderly. Cover your mouth when you cough or sneeze. Always remember to wash your hands.  Get Help Right Away If: You develop worsening fever or sinus pain. You develop a severe head ache or visual changes. Your symptoms persist after you have completed your treatment plan.  Make sure you Understand these instructions. Will watch your condition. Will get help right away if you are not doing well or get worse.  Your e-visit  answers were reviewed by a board certified advanced clinical practitioner to complete your personal care plan.  Depending on the condition, your plan could have included both over the counter or prescription medications.  If there is a problem please reply  once you have received a response from your provider.  Your safety is important to us .  If you have drug allergies check your prescription carefully.    You can use MyChart to ask questions about today's visit, request a non-urgent call back, or ask for a work or school excuse for 24 hours related to this e-Visit. If it has been greater than 24 hours you will need to follow up with your provider, or enter a new e-Visit to address those concerns.  You will get an e-mail in the next two days asking about your experience.  I hope that your e-visit has been valuable and will speed your recovery. Thank you for using e-visits.  I have spent 5 minutes in review of e-visit questionnaire, review and updating patient chart, medical decision making and response to patient.   Elsie Velma Lunger, PA-C
